# Patient Record
Sex: Female | Born: 1970 | Race: Asian | Marital: Married | Smoking: Never smoker
Health system: Southern US, Community
[De-identification: ages and names within clinical notes are randomized; demographics above are authoritative.]

## PROBLEM LIST (undated history)

## (undated) DIAGNOSIS — Z8632 Personal history of gestational diabetes: Secondary | ICD-10-CM

## (undated) DIAGNOSIS — E663 Overweight: Secondary | ICD-10-CM

## (undated) DIAGNOSIS — I10 Essential (primary) hypertension: Secondary | ICD-10-CM

## (undated) DIAGNOSIS — S93402S Sprain of unspecified ligament of left ankle, sequela: Secondary | ICD-10-CM

## (undated) DIAGNOSIS — F419 Anxiety disorder, unspecified: Secondary | ICD-10-CM

## (undated) DIAGNOSIS — T7840XA Allergy, unspecified, initial encounter: Secondary | ICD-10-CM

## (undated) DIAGNOSIS — Z8759 Personal history of other complications of pregnancy, childbirth and the puerperium: Secondary | ICD-10-CM

## (undated) DIAGNOSIS — Z87442 Personal history of urinary calculi: Secondary | ICD-10-CM

## (undated) HISTORY — DX: Allergy, unspecified, initial encounter: T78.40XA

## (undated) HISTORY — DX: Personal history of other complications of pregnancy, childbirth and the puerperium: Z87.59

## (undated) HISTORY — PX: TUBAL LIGATION: SHX77

## (undated) HISTORY — DX: Sprain of unspecified ligament of left ankle, sequela: S93.402S

## (undated) HISTORY — DX: Personal history of urinary calculi: Z87.442

## (undated) HISTORY — DX: Essential (primary) hypertension: I10

## (undated) HISTORY — PX: BREAST ENHANCEMENT SURGERY: SHX7

## (undated) HISTORY — DX: Anxiety disorder, unspecified: F41.9

## (undated) HISTORY — DX: Personal history of gestational diabetes: Z86.32

## (undated) HISTORY — DX: Overweight: E66.3

## (undated) HISTORY — PX: EXTRACORPOREAL SHOCK WAVE LITHOTRIPSY: SHX1557

---

## 1995-02-05 DIAGNOSIS — O1415 Severe pre-eclampsia, complicating the puerperium: Secondary | ICD-10-CM

## 1995-07-04 HISTORY — PX: COLPOSCOPY: SHX161

## 1995-09-01 HISTORY — PX: GYNECOLOGIC CRYOSURGERY: SHX857

## 2000-04-13 HISTORY — PX: DIAGNOSTIC LAPAROSCOPY: SUR761

## 2004-07-03 HISTORY — PX: AUGMENTATION MAMMAPLASTY: SUR837

## 2005-04-04 ENCOUNTER — Ambulatory Visit: Payer: Self-pay | Admitting: Obstetrics & Gynecology

## 2005-04-18 ENCOUNTER — Ambulatory Visit: Payer: Self-pay | Admitting: Obstetrics & Gynecology

## 2005-05-04 ENCOUNTER — Observation Stay: Payer: Self-pay | Admitting: Unknown Physician Specialty

## 2005-05-06 ENCOUNTER — Ambulatory Visit: Payer: Self-pay

## 2005-05-12 ENCOUNTER — Inpatient Hospital Stay: Payer: Self-pay | Admitting: Obstetrics & Gynecology

## 2005-05-13 DIAGNOSIS — O1410 Severe pre-eclampsia, unspecified trimester: Secondary | ICD-10-CM

## 2005-05-13 DIAGNOSIS — O24419 Gestational diabetes mellitus in pregnancy, unspecified control: Secondary | ICD-10-CM

## 2007-07-18 ENCOUNTER — Encounter: Payer: Self-pay | Admitting: Maternal & Fetal Medicine

## 2007-11-08 ENCOUNTER — Observation Stay: Payer: Self-pay | Admitting: Obstetrics & Gynecology

## 2007-11-09 ENCOUNTER — Ambulatory Visit: Payer: Self-pay

## 2007-12-16 ENCOUNTER — Inpatient Hospital Stay: Payer: Self-pay | Admitting: Obstetrics and Gynecology

## 2007-12-17 DIAGNOSIS — O24419 Gestational diabetes mellitus in pregnancy, unspecified control: Secondary | ICD-10-CM

## 2007-12-17 DIAGNOSIS — O1490 Unspecified pre-eclampsia, unspecified trimester: Secondary | ICD-10-CM

## 2010-07-03 HISTORY — PX: CYSTOSCOPY: SUR368

## 2011-03-24 ENCOUNTER — Emergency Department: Payer: Self-pay | Admitting: *Deleted

## 2011-04-03 ENCOUNTER — Ambulatory Visit: Payer: Self-pay | Admitting: Urology

## 2011-04-04 ENCOUNTER — Ambulatory Visit: Payer: Self-pay | Admitting: Urology

## 2011-04-10 ENCOUNTER — Ambulatory Visit: Payer: Self-pay | Admitting: Urology

## 2011-04-13 ENCOUNTER — Ambulatory Visit: Payer: Self-pay | Admitting: Urology

## 2011-04-28 ENCOUNTER — Ambulatory Visit: Payer: Self-pay | Admitting: Urology

## 2011-07-31 ENCOUNTER — Ambulatory Visit: Payer: Self-pay | Admitting: Urology

## 2012-10-01 ENCOUNTER — Ambulatory Visit: Payer: Self-pay | Admitting: Gynecologic Oncology

## 2012-10-31 ENCOUNTER — Ambulatory Visit: Payer: Self-pay | Admitting: Gynecologic Oncology

## 2014-08-03 LAB — HM MAMMOGRAPHY: HM Mammogram: NORMAL (ref 0–4)

## 2015-02-17 LAB — LIPID PANEL
CHOLESTEROL: 194 mg/dL (ref 0–200)
HDL: 29 mg/dL — AB (ref 35–70)
LDL CALC: 102 mg/dL
TRIGLYCERIDES: 317 mg/dL — AB (ref 40–160)

## 2015-02-17 LAB — BASIC METABOLIC PANEL: GLUCOSE: 157 mg/dL

## 2015-02-17 LAB — HEMOGLOBIN A1C: HEMOGLOBIN A1C: 6

## 2015-11-04 ENCOUNTER — Ambulatory Visit (INDEPENDENT_AMBULATORY_CARE_PROVIDER_SITE_OTHER): Payer: 59 | Admitting: Family Medicine

## 2015-11-04 ENCOUNTER — Encounter: Payer: Self-pay | Admitting: Family Medicine

## 2015-11-04 VITALS — BP 108/66 | HR 94 | Temp 98.2°F | Resp 16 | Ht <= 58 in | Wt 129.7 lb

## 2015-11-04 DIAGNOSIS — K5909 Other constipation: Secondary | ICD-10-CM

## 2015-11-04 DIAGNOSIS — R5383 Other fatigue: Secondary | ICD-10-CM

## 2015-11-04 DIAGNOSIS — K5900 Constipation, unspecified: Secondary | ICD-10-CM

## 2015-11-04 DIAGNOSIS — M71372 Other bursal cyst, left ankle and foot: Secondary | ICD-10-CM | POA: Diagnosis not present

## 2015-11-04 DIAGNOSIS — J302 Other seasonal allergic rhinitis: Secondary | ICD-10-CM | POA: Diagnosis not present

## 2015-11-04 DIAGNOSIS — F411 Generalized anxiety disorder: Secondary | ICD-10-CM | POA: Diagnosis not present

## 2015-11-04 DIAGNOSIS — E663 Overweight: Secondary | ICD-10-CM | POA: Diagnosis not present

## 2015-11-04 MED ORDER — DULOXETINE HCL 30 MG PO CPEP: 30.0000 mg | ORAL_CAPSULE | Freq: Every day | ORAL | Status: DC

## 2015-11-04 NOTE — Progress Notes (Signed)
Name: Kathleen Villanueva   MRN: [id number]    DOB: 01-02-1971   Date:11/04/2015       Progress Note  Subjective  Chief Complaint  Chief Complaint  Patient presents with  . Weight Gain    Patient would like to talk about weight loss options due to straing her ankle and being in pain. Would like to know is phentermine would be a good fit for her? Patient is stressed from work and needs a boost to help lose weigh and help with a appetite suppression.  . Constipation    Would like to discuss medication to help her go to the bathroom regularly, states she sometimes misses a day here and there.    HPI  Overweight: she wants medication to lose weight, but she is not willing to change her diet. She drinks coffee from [employer], drinks tea at work, she skips breakfast, and lunch, eats a lot of carbs at dinner. She wants phentermine, but explained she is not ready to lose weight, and she does not qualify for weight loss medication since she is overweight without risk factors. No exercising because of ankle cyst - wearing brace and seeing podiatrist  Chronic constipation: she skips bowel movements, but has been like that for a long time.   Fatigue: she states she feels tired all the time, she had labs done at work, but not sure if TSH , vitamin D and B12 were done.   Seasonal allergies: doing well, no symptoms at this time, she uses nasal spray prn  GAD: she states she had panic attacks in the past, when her daughter was young. She is getting worse feeling nervous all the time. Husband is complaining about her being snappy.   GAD 7 : Generalized Anxiety Score 11/04/2015  Nervous, Anxious, on Edge 3  Control/stop worrying 3  Worry too much - different things 3  Trouble relaxing 3  Restless 1  Easily annoyed or irritable 3  Afraid - awful might happen 3  Total GAD 7 Score 19  Anxiety Difficulty Very difficult      Patient Active Problem List   Diagnosis Date Noted  . Overweight (BMI 25.0-29.9)  11/04/2015  . Seasonal allergies 11/04/2015  . Chronic constipation 11/04/2015  . Other bursal cyst, left ankle and foot 11/04/2015    Past Surgical History  Procedure Laterality Date  . Tubal ligation    . Extracorporeal shock wave lithotripsy      Family History  Problem Relation Age of Onset  . Cancer Mother     cervical  . Cancer Maternal Uncle     Kidney    Social History   Social History  . Marital Status: Married    Spouse Name: N/A  . Number of Children: N/A  . Years of Education: N/A   Occupational History  . Not on file.   Social History Main Topics  . Smoking status: Never Smoker   . Smokeless tobacco: Never Used  . Alcohol Use: No  . Drug Use: No  . Sexual Activity:    Partners: Male   Other Topics Concern  . Not on file   Social History Narrative  . No narrative on file     Current outpatient prescriptions:  Marland Kitchen  Multiple Vitamins-Minerals (MULTIVITAMIN GUMMIES ADULT PO), Take by mouth., Disp: , Rfl:   No Known Allergies   ROS  Constitutional: Negative for fever or weight change.  Respiratory: Negative for cough and shortness of breath.   Cardiovascular: Negative for  chest pain or palpitations.  Gastrointestinal: Negative for abdominal pain, no bowel changes.  Musculoskeletal: Positive for gait problem or joint swelling.  Skin: Negative for rash.  Neurological: Negative for dizziness or headache.  No other specific complaints in a complete review of systems (except as listed in HPI above).   Objective  Filed Vitals:   11/04/15 1102  BP: 108/66  Pulse: 94  Temp: 98.2 F (36.8 C)  TempSrc: Oral  Resp: 16  Height: 4' 9"  (1.448 m)  Weight: 129 lb 11.2 oz (58.832 kg)  SpO2: 95%    Body mass index is 28.06 kg/(m^2).  Physical Exam  Constitutional: Patient appears well-developed and well-nourished. Overweight.  No distress.  HEENT: head atraumatic, normocephalic, pupils equal and reactive to light, neck supple, throat within  normal limits Cardiovascular: Normal rate, regular rhythm and normal heart sounds.  No murmur heard. No BLE edema. Pulmonary/Chest: Effort normal and breath sounds normal. No respiratory distress. Abdominal: Soft.  There is no tenderness. Psychiatric: Patient has a normal mood and affect. behavior is normal. Judgment and thought content normal.   PHQ2/9: Depression screen PHQ 2/9 11/04/2015  Decreased Interest 0  Down, Depressed, Hopeless 0  PHQ - 2 Score 0     Fall Risk: Fall Risk  11/04/2015  Falls in the past year? No    Functional Status Survey: Is the patient deaf or have difficulty hearing?: No Does the patient have difficulty seeing, even when wearing glasses/contacts?: No Does the patient have difficulty concentrating, remembering, or making decisions?: No Does the patient have difficulty walking or climbing stairs?: No Does the patient have difficulty dressing or bathing?: No Does the patient have difficulty doing errands alone such as visiting a doctor's office or shopping?: No    Assessment & Plan  1. Overweight (BMI 25.0-29.9)  Discussed with the patient the risk posed by an increased BMI. Discussed importance of portion control, calorie counting and at least 150 minutes of physical activity weekly. Avoid sweet beverages and drink more water. Eat at least 6 servings of fruit and vegetables daily   2. Seasonal allergies  Doing well at this time  3. Other fatigue  - TSH - VITAMIN D 25 Hydroxy (Vit-D Deficiency, Fractures) - B12  4. Chronic constipation  Discussed life style modification for now  5. Other bursal cyst, left ankle and foot  Seeing Dr. Jens Som, wearing soft sleeve

## 2015-11-05 ENCOUNTER — Other Ambulatory Visit: Payer: Self-pay | Admitting: Family Medicine

## 2015-11-05 ENCOUNTER — Encounter: Payer: Self-pay | Admitting: Family Medicine

## 2015-11-05 LAB — TSH: TSH: 0.681 u[IU]/mL (ref 0.450–4.500)

## 2015-11-05 LAB — VITAMIN B12: Vitamin B-12: 526 pg/mL (ref 211–946)

## 2015-11-05 LAB — VITAMIN D 25 HYDROXY (VIT D DEFICIENCY, FRACTURES): Vit D, 25-Hydroxy: 16 ng/mL — ABNORMAL LOW (ref 30.0–100.0)

## 2015-11-05 MED ORDER — VITAMIN D (ERGOCALCIFEROL) 1.25 MG (50000 UNIT) PO CAPS: 50000.0000 [IU] | ORAL_CAPSULE | ORAL | Status: DC

## 2015-11-25 ENCOUNTER — Encounter: Payer: Self-pay | Admitting: Family Medicine

## 2015-11-25 ENCOUNTER — Ambulatory Visit (INDEPENDENT_AMBULATORY_CARE_PROVIDER_SITE_OTHER): Payer: 59 | Admitting: Family Medicine

## 2015-11-25 VITALS — BP 108/64 | HR 89 | Temp 98.3°F | Resp 18 | Ht <= 58 in | Wt 126.5 lb

## 2015-11-25 DIAGNOSIS — E559 Vitamin D deficiency, unspecified: Secondary | ICD-10-CM | POA: Diagnosis not present

## 2015-11-25 DIAGNOSIS — E785 Hyperlipidemia, unspecified: Secondary | ICD-10-CM | POA: Diagnosis not present

## 2015-11-25 DIAGNOSIS — Z91018 Allergy to other foods: Secondary | ICD-10-CM | POA: Insufficient documentation

## 2015-11-25 DIAGNOSIS — E663 Overweight: Secondary | ICD-10-CM

## 2015-11-25 DIAGNOSIS — E786 Lipoprotein deficiency: Secondary | ICD-10-CM | POA: Diagnosis not present

## 2015-11-25 DIAGNOSIS — F411 Generalized anxiety disorder: Secondary | ICD-10-CM | POA: Diagnosis not present

## 2015-11-25 DIAGNOSIS — Z87442 Personal history of urinary calculi: Secondary | ICD-10-CM | POA: Insufficient documentation

## 2015-11-25 NOTE — Progress Notes (Signed)
Name: Kathleen Villanueva   MRN: [id number]    DOB: 1970/11/07   Date:11/25/2015       Progress Note  Subjective  Chief Complaint  Chief Complaint  Patient presents with  . Follow-up    patient is here for her 3-week f/u  . Obesity    Patient has lost 4 pounds since last visit due to cutting out coffee and sweet tea, cut back on pasta and rice. Patient would like to talk about weight lose medication to help her increase her weight loss.  . Anxiety    Patient has been controlling it better due to calling her son who is at [employer] daily due to being worried about him, and decreasing work load and symptoms have improved.    HPI  Overweight: She has changed her diet, eating fish twice weekly, cutting down on past, rice, eating fruit for breakfast. Stopped drinking [employer], no longer drinking sweet tea. She is also riding bikes outside with her children.   GAD: she states she had panic attacks in the past, when her daughter was young. She changed her work load, communicating better at home and at work, never started Cymbalta. Stopped caffeine and is feeling well.   Dyslipidemia: she has changed her diet, exercising more.   Patient Active Problem List   Diagnosis Date Noted  . Allergy to other foods 11/25/2015  . H/O renal calculi 11/25/2015  . Overweight (BMI 25.0-29.9) 11/04/2015  . Seasonal allergies 11/04/2015  . Chronic constipation 11/04/2015  . Other bursal cyst, left ankle and foot 11/04/2015    Past Surgical History  Procedure Laterality Date  . Tubal ligation    . Extracorporeal shock wave lithotripsy      Family History  Problem Relation Age of Onset  . Cancer Mother     cervical  . Cancer Maternal Uncle     Kidney    Social History   Social History  . Marital Status: Married    Spouse Name: N/A  . Number of Children: N/A  . Years of Education: N/A   Occupational History  . Not on file.   Social History Main Topics  . Smoking status: Never Smoker    . Smokeless tobacco: Never Used  . Alcohol Use: No  . Drug Use: No  . Sexual Activity:    Partners: Male   Other Topics Concern  . Not on file   Social History Narrative     Current outpatient prescriptions:  Marland Kitchen  Multiple Vitamins-Minerals (MULTIVITAMIN GUMMIES ADULT PO), Take by mouth., Disp: , Rfl:  .  Vitamin D, Ergocalciferol, (DRISDOL) 50000 units CAPS capsule, Take 1 capsule (50,000 Units total) by mouth every 7 (seven) days., Disp: 12 capsule, Rfl: 0  No Known Allergies   ROS  Ten systems reviewed and is negative except as mentioned in HPI   Objective  Filed Vitals:   11/25/15 1023  BP: 108/64  Pulse: 89  Temp: 98.3 F (36.8 C)  TempSrc: Oral  Resp: 18  Height: 4' 9"  (1.448 m)  Weight: 126 lb 8 oz (57.38 kg)  SpO2: 98%    Body mass index is 27.37 kg/(m^2).  Physical Exam  Constitutional: Patient appears well-developed and well-nourished. Overweight  No distress.  HEENT: head atraumatic, normocephalic, pupils equal and reactive to light,  neck supple, throat within normal limits Cardiovascular: Normal rate, regular rhythm and normal heart sounds.  No murmur heard. No BLE edema. Pulmonary/Chest: Effort normal and breath sounds normal. No respiratory distress. Abdominal:  Soft.  There is no tenderness. Psychiatric: Patient has a normal mood and affect. behavior is normal. Judgment and thought content normal.  Recent Results (from the past 2160 hour(s))  TSH     Status: None   Collection Time: 11/04/15 11:55 AM  Result Value Ref Range   TSH 0.681 0.450 - 4.500 uIU/mL  VITAMIN D 25 Hydroxy (Vit-D Deficiency, Fractures)     Status: Abnormal   Collection Time: 11/04/15 11:55 AM  Result Value Ref Range   Vit D, 25-Hydroxy 16.0 (L) 30.0 - 100.0 ng/mL    Comment: Vitamin D deficiency has been defined by the [place] and an Endocrine Society practice guideline as a level of serum 25-OH vitamin D less than 20 ng/mL (1,2). The Endocrine Society  went on to further define vitamin D insufficiency as a level between 21 and 29 ng/mL (2). 1. IOM (Institute of Medicine). 2010. Dietary reference    intakes for calcium and D. [place]: The    [employer]. 2. Holick MF, Binkley Candler, Bischoff-Ferrari HA, et al.    Evaluation, treatment, and prevention of vitamin D    deficiency: an Endocrine Society clinical practice    guideline. JCEM. 2011 Jul; 96(7):1911-30.   B12     Status: None   Collection Time: 11/04/15 11:55 AM  Result Value Ref Range   Vitamin B-12 526 211 - 946 pg/mL    PHQ2/9: Depression screen PHQ 2/9 11/04/2015  Decreased Interest 0  Down, Depressed, Hopeless 0  PHQ - 2 Score 0    Fall Risk: Fall Risk  11/04/2015  Falls in the past year? No     Assessment & Plan  1. GAD (generalized anxiety disorder)  Doing great, change  2. Overweight (BMI 25.0-29.9)  Doing great, changing her diet, more active, lost 4 lbs in the past few weeks with life style modivication   3. Vitamin D deficiency  Continue supplementation   4. Hyperlipidemia with low HDL  Lipid panel shows low HDL : to improve HDL patient  needs to eat tree nuts ( pecans/pistachios/almonds ) four times weekly, eat fish two times weekly  and exercise  at least 150 minutes per week

## 2015-12-02 ENCOUNTER — Other Ambulatory Visit: Payer: Self-pay | Admitting: Family Medicine

## 2015-12-02 NOTE — Telephone Encounter (Signed)
Patient requesting refill. 

## 2016-02-01 ENCOUNTER — Other Ambulatory Visit: Payer: Self-pay | Admitting: Family Medicine

## 2016-02-01 MED ORDER — VITAMIN D (ERGOCALCIFEROL) 1.25 MG (50000 UNIT) PO CAPS: 50000.0000 [IU] | ORAL_CAPSULE | ORAL | 0 refills | Status: DC

## 2016-02-16 LAB — LIPID PANEL
Cholesterol: 185 mg/dL (ref 0–200)
HDL: 34 mg/dL — AB (ref 35–70)
LDL Cholesterol: 110 mg/dL
Triglycerides: 204 mg/dL — AB (ref 40–160)

## 2016-02-16 LAB — BASIC METABOLIC PANEL: Glucose: 118 mg/dL

## 2016-02-16 LAB — HEMOGLOBIN A1C: Hemoglobin A1C: 5.5

## 2016-02-25 ENCOUNTER — Ambulatory Visit (INDEPENDENT_AMBULATORY_CARE_PROVIDER_SITE_OTHER): Payer: 59 | Admitting: Family Medicine

## 2016-02-25 ENCOUNTER — Encounter: Payer: Self-pay | Admitting: Family Medicine

## 2016-02-25 VITALS — BP 112/64 | HR 92 | Temp 98.0°F | Resp 16 | Ht <= 58 in | Wt 120.1 lb

## 2016-02-25 DIAGNOSIS — F411 Generalized anxiety disorder: Secondary | ICD-10-CM

## 2016-02-25 DIAGNOSIS — E559 Vitamin D deficiency, unspecified: Secondary | ICD-10-CM

## 2016-02-25 DIAGNOSIS — K5900 Constipation, unspecified: Secondary | ICD-10-CM

## 2016-02-25 DIAGNOSIS — E663 Overweight: Secondary | ICD-10-CM | POA: Diagnosis not present

## 2016-02-25 DIAGNOSIS — R739 Hyperglycemia, unspecified: Secondary | ICD-10-CM

## 2016-02-25 DIAGNOSIS — E785 Hyperlipidemia, unspecified: Secondary | ICD-10-CM

## 2016-02-25 DIAGNOSIS — K5909 Other constipation: Secondary | ICD-10-CM

## 2016-02-25 MED ORDER — MULTIVITAMIN GUMMIES ADULT PO CHEW: 1.0000 | CHEWABLE_TABLET | Freq: Every day | ORAL | 3 refills | Status: DC

## 2016-02-25 NOTE — Progress Notes (Signed)
Name: Kathleen Villanueva   MRN: [id number]    DOB: Nov 26, 1970   Date:02/25/2016       Progress Note  Subjective  Chief Complaint  Chief Complaint  Patient presents with  . Medication Refill    Needs Prescription for Vitamins due to Flex Spending Card will pay for it with a prescription.     HPI  Hyperglycemia: last year her hgbA1C was 6 and is down to 5.5%. She stopped eating rice, and desserts , smaller portions of pasta and occasionally bread. She is eating fish and salads on a regular basis. She has been more active, cooking at home and has lost 7 lbs, HDL has gone and LDL has gone down and hgbA1C is back to normal  Dyslipidemia: discussed ways to improved HDL even more  GAD: not having symptoms, she is excited that her weight is down, father had bypass surgery and is doing well now, face timing son more often to fill the void and is coping well with life right now.   Patient Active Problem List   Diagnosis Date Noted  . Allergy to other foods 11/25/2015  . H/O renal calculi 11/25/2015  . Vitamin D deficiency 11/25/2015  . Hyperlipidemia with low HDL 11/25/2015  . Overweight (BMI 25.0-29.9) 11/04/2015  . Seasonal allergies 11/04/2015  . Chronic constipation 11/04/2015  . Other bursal cyst, left ankle and foot 11/04/2015    Past Surgical History:  Procedure Laterality Date  . EXTRACORPOREAL SHOCK WAVE LITHOTRIPSY    . TUBAL LIGATION      Family History  Problem Relation Age of Onset  . Cancer Mother     cervical  . Cancer Maternal Uncle     Kidney    Social History   Social History  . Marital status: Married    Spouse name: N/A  . Number of children: N/A  . Years of education: N/A   Occupational History  . Not on file.   Social History Main Topics  . Smoking status: Never Smoker  . Smokeless tobacco: Never Used  . Alcohol use No  . Drug use: No  . Sexual activity: Yes    Partners: Male   Other Topics Concern  . Not on file   Social History Narrative   . No narrative on file     Current Outpatient Prescriptions:  Marland Kitchen  Multiple Vitamins-Minerals (MULTIVITAMIN GUMMIES ADULT PO), Take by mouth., Disp: , Rfl:  .  Vitamin D, Ergocalciferol, (DRISDOL) 50000 units CAPS capsule, Take 1 capsule (50,000 Units total) by mouth every 7 (seven) days., Disp: 4 capsule, Rfl: 0  No Known Allergies   ROS  Constitutional: Negative for fever , positive for weight change.  Respiratory: Negative for cough and shortness of breath.   Cardiovascular: Negative for chest pain or palpitations.  Gastrointestinal: Negative for abdominal pain, no bowel changes.  Musculoskeletal: Negative for gait problem or joint swelling.  Skin: Negative for rash.  Neurological: Negative for dizziness or headache.  No other specific complaints in a complete review of systems (except as listed in HPI above).  Objective  Vitals:   02/25/16 1452  BP: 112/64  Pulse: 92  Resp: 16  Temp: 98 F (36.7 C)  TempSrc: Oral  SpO2: 97%  Weight: 120 lb 1.6 oz (54.5 kg)  Height: 4' 9"  (1.448 m)    Body mass index is 25.99 kg/m.  Physical Exam  Constitutional: Patient appears well-developed and well-nourished.  No distress.  HEENT: head atraumatic, normocephalic, pupils equal and reactive to  light,  neck supple, throat within normal limits Cardiovascular: Normal rate, regular rhythm and normal heart sounds.  No murmur heard. No BLE edema. Pulmonary/Chest: Effort normal and breath sounds normal. No respiratory distress. Abdominal: Soft.  There is no tenderness. Psychiatric: Patient has a normal mood and affect. behavior is normal. Judgment and thought content normal.  Recent Results (from the past 2160 hour(s))  Basic metabolic panel     Status: None   Collection Time: 02/16/16 12:00 AM  Result Value Ref Range   Glucose 118 mg/dL  Lipid panel     Status: Abnormal   Collection Time: 02/16/16 12:00 AM  Result Value Ref Range   Triglycerides 204 (A) 40 - 160 mg/dL    Cholesterol 185 0 - 200 mg/dL   HDL 34 (A) 35 - 70 mg/dL   LDL Cholesterol 110 mg/dL  Hemoglobin A1c     Status: None   Collection Time: 02/16/16 12:00 AM  Result Value Ref Range   Hemoglobin A1C 5.5       PHQ2/9: Depression screen [facility] 2/9 02/25/2016 11/04/2015  Decreased Interest 0 0  Down, Depressed, Hopeless 0 0  PHQ - 2 Score 0 0    Fall Risk: Fall Risk  02/25/2016 11/04/2015  Falls in the past year? No No      Functional Status Survey: Is the patient deaf or have difficulty hearing?: No Does the patient have difficulty seeing, even when wearing glasses/contacts?: No Does the patient have difficulty concentrating, remembering, or making decisions?: No Does the patient have difficulty walking or climbing stairs?: No Does the patient have difficulty dressing or bathing?: No Does the patient have difficulty doing errands alone such as visiting a doctor's office or shopping?: No    Assessment & Plan   1. Dyslipidemia  Lipid panel shows low HDL : to improve HDL patient  needs to eat tree nuts ( pecans/pistachios/almonds ) four times weekly, eat fish two times weekly  and exercise  at least 150 minutes per week  2. Vitamin D deficiency  Continue supplementation   3. Overweight (BMI 25.0-29.9)  Almost to goal weight, it needs to be 115 lbs  4. Chronic constipation  Resolved with dietary modification   5. Hyperglycemia  Doing well now, hgbA1C is at goal   6. GAD (generalized anxiety disorder)  resolved

## 2016-02-29 ENCOUNTER — Encounter: Payer: Self-pay | Admitting: Family Medicine

## 2016-08-25 ENCOUNTER — Ambulatory Visit (INDEPENDENT_AMBULATORY_CARE_PROVIDER_SITE_OTHER): Payer: 59 | Admitting: Family Medicine

## 2016-08-25 ENCOUNTER — Encounter: Payer: Self-pay | Admitting: Family Medicine

## 2016-08-25 VITALS — BP 110/68 | HR 95 | Temp 98.0°F | Resp 16 | Ht <= 58 in | Wt 119.4 lb

## 2016-08-25 DIAGNOSIS — K5909 Other constipation: Secondary | ICD-10-CM

## 2016-08-25 DIAGNOSIS — E559 Vitamin D deficiency, unspecified: Secondary | ICD-10-CM | POA: Diagnosis not present

## 2016-08-25 DIAGNOSIS — E785 Hyperlipidemia, unspecified: Secondary | ICD-10-CM

## 2016-08-25 DIAGNOSIS — F411 Generalized anxiety disorder: Secondary | ICD-10-CM | POA: Insufficient documentation

## 2016-08-25 DIAGNOSIS — E663 Overweight: Secondary | ICD-10-CM | POA: Diagnosis not present

## 2016-08-25 DIAGNOSIS — R739 Hyperglycemia, unspecified: Secondary | ICD-10-CM | POA: Diagnosis not present

## 2016-08-25 MED ORDER — VITAMIN D (ERGOCALCIFEROL) 1.25 MG (50000 UNIT) PO CAPS: 50000.0000 [IU] | ORAL_CAPSULE | ORAL | 3 refills | Status: DC

## 2016-08-25 NOTE — Progress Notes (Signed)
Name: Kathleen Villanueva   MRN: [id number]    DOB: 16-Nov-1970   Date:08/25/2016       Progress Note  Subjective  Chief Complaint  Chief Complaint  Patient presents with  . Anxiety    doing better  . dyslipidemia  . Obesity  . Flu Vaccine    pt refused    HPI  Hyperglycemia: last year her hgbA1C was 6 and is down to 5.5%. She stopped eating rice, and desserts, smaller portions of pasta and occasionally bread. She is eating fish and salads on a regular basis. She has been more active, cooking at home and had lost 7 lbs initially and now has been maintaining.  Dyslipidemia: discussed ways to improved HDL even more, we will recheck labs. Continue life style changes  GAD: not having symptoms, eating healthier, father had bypass surgery and is doing well now, she has been doing video calls with her  son more often and she has been doing much better.   Vitamin D deficiency: she prefers taking weekly medications  Chronic constipation: doing much better with change in bowel movement. She has bowel movements about once a day, skips at most one day. No straining , no blood in stools   Patient Active Problem List   Diagnosis Date Noted  . GAD (generalized anxiety disorder) 08/25/2016  . Allergy to other foods 11/25/2015  . H/O renal calculi 11/25/2015  . Vitamin D deficiency 11/25/2015  . Hyperlipidemia with low HDL 11/25/2015  . Overweight (BMI 25.0-29.9) 11/04/2015  . Seasonal allergies 11/04/2015  . Chronic constipation 11/04/2015  . Other bursal cyst, left ankle and foot 11/04/2015    Past Surgical History:  Procedure Laterality Date  . EXTRACORPOREAL SHOCK WAVE LITHOTRIPSY    . TUBAL LIGATION      Family History  Problem Relation Age of Onset  . Cancer Mother     cervical  . Cancer Maternal Uncle     Kidney    Social History   Social History  . Marital status: Married    Spouse name: N/A  . Number of children: N/A  . Years of education: N/A   Occupational History   . Not on file.   Social History Main Topics  . Smoking status: Never Smoker  . Smokeless tobacco: Never Used  . Alcohol use No  . Drug use: No  . Sexual activity: Yes    Partners: Male   Other Topics Concern  . Not on file   Social History Narrative  . No narrative on file     Current Outpatient Prescriptions:  Marland Kitchen  Multiple Vitamins-Minerals (MULTIVITAMIN GUMMIES ADULT) CHEW, Chew 1 tablet by mouth daily., Disp: 100 tablet, Rfl: 3 .  Vitamin D, Ergocalciferol, (DRISDOL) 50000 units CAPS capsule, Take 1 capsule (50,000 Units total) by mouth every 7 (seven) days., Disp: 12 capsule, Rfl: 3  No Known Allergies   ROS  Constitutional: Negative for fever or weight change.  Respiratory: Negative for cough and shortness of breath.   Cardiovascular: Negative for chest pain or palpitations.  Gastrointestinal: Negative for abdominal pain, no bowel changes.  Musculoskeletal: Negative for gait problem or joint swelling.  Skin: Negative for rash.  Neurological: Negative for dizziness or headache.  No other specific complaints in a complete review of systems (except as listed in HPI above).  Objective  Vitals:   08/25/16 1336  BP: 110/68  Pulse: 95  Resp: 16  Temp: 98 F (36.7 C)  SpO2: 98%  Weight: 119 lb 7  oz (54.2 kg)  Height: 4' 9"  (1.448 m)    Body mass index is 25.85 kg/m.  Physical Exam  Constitutional: Patient appears well-developed and well-nourished. Overweight No distress.  HEENT: head atraumatic, normocephalic, pupils equal and reactive to light,  neck supple, throat within normal limits Cardiovascular: Normal rate, regular rhythm and normal heart sounds.  No murmur heard. No BLE edema. Pulmonary/Chest: Effort normal and breath sounds normal. No respiratory distress. Abdominal: Soft.  There is no tenderness. Psychiatric: Patient has a normal mood and affect. behavior is normal. Judgment and thought content normal.  PHQ2/9: Depression screen [facility] 2/9  02/25/2016 11/04/2015  Decreased Interest 0 0  Down, Depressed, Hopeless 0 0  PHQ - 2 Score 0 0     Fall Risk: Fall Risk  02/25/2016 11/04/2015  Falls in the past year? No No     Assessment & Plan  1. Dyslipidemia  - Lipid panel - Comprehensive metabolic panel  2. Vitamin D deficiency  - Vitamin D, Ergocalciferol, (DRISDOL) 50000 units CAPS capsule; Take 1 capsule (50,000 Units total) by mouth every 7 (seven) days.  Dispense: 12 capsule; Refill: 3 - Vitamin B12 - VITAMIN D 25 Hydroxy (Vit-D Deficiency, Fractures)  3. Hyperglycemia  - Hemoglobin A1c - Insulin, 2 Hour  4. Overweight (BMI 25.0-29.9)  - CBC with Differential/Platelet  5. GAD (generalized anxiety disorder)  She is doing better, she states last year a lot of her relatives died and she was very worried about her father's death, but doing much better now - CBC with Differential/Platelet - TSH  6. Chronic constipation  Doing better with change in diet

## 2016-09-20 ENCOUNTER — Encounter: Payer: Self-pay | Admitting: Certified Nurse Midwife

## 2016-09-20 ENCOUNTER — Ambulatory Visit (INDEPENDENT_AMBULATORY_CARE_PROVIDER_SITE_OTHER): Payer: 59 | Admitting: Certified Nurse Midwife

## 2016-09-20 ENCOUNTER — Other Ambulatory Visit: Payer: Self-pay | Admitting: Family Medicine

## 2016-09-20 VITALS — BP 130/90 | HR 84 | Ht <= 58 in | Wt 123.0 lb

## 2016-09-20 DIAGNOSIS — Z01419 Encounter for gynecological examination (general) (routine) without abnormal findings: Secondary | ICD-10-CM | POA: Diagnosis not present

## 2016-09-20 DIAGNOSIS — Z1231 Encounter for screening mammogram for malignant neoplasm of breast: Secondary | ICD-10-CM

## 2016-09-20 DIAGNOSIS — Z124 Encounter for screening for malignant neoplasm of cervix: Secondary | ICD-10-CM

## 2016-09-20 DIAGNOSIS — Z1239 Encounter for other screening for malignant neoplasm of breast: Secondary | ICD-10-CM

## 2016-09-20 MED ORDER — CALCIUM CITRATE-VITAMIN D 250-100 MG-UNIT PO TABS: 2.0000 | ORAL_TABLET | Freq: Every day | ORAL | 11 refills | Status: DC

## 2016-09-20 NOTE — Progress Notes (Signed)
Gynecology Annual Exam  PCP: Loistine Chance, MD  Chief Complaint:  Chief Complaint  Patient presents with  . Gynecologic Exam    History of Present Illness: Patient is a 46 y.o. Kathleen Villanueva presents for annual exam. The patient has no gyn complaints today. Patient was prescribed clobetasol ointment at her last annual for a chronic pruritic area on her right labia. This resolved the pruritis after a short course of therapy LMP: Patient's last menstrual period was 09/05/2016. Menses are monthly, lasting 7 days with 2 heavier days requiring pad changes q2 hours. Has noticed clots overnight on pad that are quarter size.Marland Kitchen Has dysmenorrhea with heavier flow that is relieved with Tylenol or Advil. Her current form of contraception is a BTL. Since her last annual 06/16/2015, she has changed her diet and eliminated caffeine from her diet, which helped her lose7#. She eats a lot of fish and salads. Does not get adequate calcium in her diet. Occasionally eats feta cheese and gets some spinach and broccoli in her diet. Takes a vitamin D2 50,000 IU every other week or so.  She exercises by walking. She does not smoke or drink alcohol.  Her last Pap smear was 06/16/2015 and was NIL/neg. Her last mammogram was 06/16/2015 and was negative. She does occasional SBEs. There is a family history of breast cancer in her maternal cousin at age 66. Genetic testing has not been done. There is no family history of ovarian cancer in her family.  Her screening for cholesterol and diabetes has been done by her PCP. She has had elevated triglycerides, but they have come down to 204 from 317 with the dietary changes she has made. HDL is a little low at 34, Total cholesterol 185, and LDL was 110 on 02/16/2016. Hemoglobin A1C was 5.5%. She will be having more labs drawn the end of this month.   Review of Systems: Review of Systems  Constitutional: Negative for chills, fever and weight loss.  HENT: Negative for congestion,  sinus pain and sore throat.   Eyes: Negative for blurred vision and pain.  Respiratory: Negative for hemoptysis, shortness of breath and wheezing.   Cardiovascular: Negative for chest pain, palpitations and leg swelling.  Gastrointestinal: Negative for abdominal pain, blood in stool, diarrhea, heartburn, nausea and vomiting.  Genitourinary: Negative for dysuria, frequency, hematuria and urgency.  Musculoskeletal: Negative for back pain, joint pain and myalgias.  Skin: Negative for itching and rash.  Neurological: Negative for dizziness, tingling and headaches.  Endo/Heme/Allergies: Negative for environmental allergies and polydipsia. Does not bruise/bleed easily.       Negative for hirsutism   Psychiatric/Behavioral: Negative for depression. The patient is not nervous/anxious and does not have insomnia.     Past Medical History:  Past Medical History:  Diagnosis Date  . Allergy   . History of gestational diabetes 2006/ 2009   with second and third pregnancy  . History of nephrolithiasis    seen by Dr. Jacqlyn Larsen Urologist   . History of pre-eclampsia    with all pregnancies  . Over weight   . Sprain of unspecified ligament of left ankle, sequela     Past Surgical History:  Past Surgical History:  Procedure Laterality Date  . BREAST ENHANCEMENT SURGERY Bilateral   . COLPOSCOPY  07/1995  . CYSTOSCOPY  2012  . DIAGNOSTIC LAPAROSCOPY  04/13/2000   for chronic RLQ pain; normal anatomy  . EXTRACORPOREAL SHOCK WAVE LITHOTRIPSY    . 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 TUBAL LIGATION       Obstetric History: [id number]  Family History:  Family History  Problem Relation Age of Onset  . Cancer Mother     cervical  . Cancer Maternal Uncle     Kidney  . Heart attack Father   . Hypertension Father   . Hypercholesterolemia Father     Social History:  Social History   Social History  . Marital status: Married    Spouse name: N/A  . Number of children: 3  . Years of  education: N/A   Occupational History  . Not on file.   Social History Main Topics  . Smoking status: Never Smoker  . Smokeless tobacco: Never Used  . Alcohol use No  . Drug use: No  . Sexual activity: Yes    Partners: Male    Birth control/ protection: Surgical   Other Topics Concern  . Not on file   Social History Narrative  . No narrative on file    Allergies:  No Known Allergies  Medications: Prior to Admission medications   Medication Sig Start Date End Date Taking? Authorizing Provider  Vitamin D, Ergocalciferol, (DRISDOL) 50000 units CAPS capsule Take 1 capsule (50,000 Units total) by mouth every 7 (seven) days. 08/25/16  Yes Steele Sizer, MD  calcium-vitamin D 250-100 MG-UNIT tablet Take 2 tablets by mouth daily. 09/20/16   Dalia Heading, CNM    Physical Exam Vitals: Blood pressure 130/90, pulse 84, height 4' 10"  (1.473 m), weight 55.8 kg (123 lb), last menstrual period 09/05/2016.  General: NAD HEENT: normocephalic, anicteric Thyroid: no enlargement, no palpable nodules Pulmonary: No increased work of breathing, CTAB Cardiovascular: RRR without murmur Breast: Breast symmetrical, s/p breast augmentation, no tenderness, no palpable nodules or masses, no skin or nipple retraction present, no nipple discharge.  No axillary, infraclavicular,  or supraclavicular lymphadenopathy. Abdomen: soft, non-tender, non-distended.  Umbilicus without lesions.  No hepatomegaly, or masses palpable. No evidence of hernia  Genitourinary:  External: Normal external female genitalia.  Normal urethral meatus, normal Bartholin's and Skene's glands.    Vagina: Normal vaginal mucosa, no evidence of prolapse.    Cervix: Grossly normal in appearance, no bleeding  Uterus: AV, NSSC,  mobile, NT  Adnexa: ovaries non-enlarged, no adnexal masses  Rectal: deferred  Lymphatic: no evidence of inguinal lymphadenopathy Extremities: no edema, erythema, or tenderness Neurologic: Grossly  intact Psychiatric: mood appropriate, affect full    Assessment: Well woman exam   Plan:  1) Mammogram  - recommend yearly screening mammogram -[facility]    2) Pap smear - Pap done  3) Osteoporosis prevention  Discussed calcium and vitamin D3 requirements. Given RX for calcium citrate  Continue exercise program and walking  4) Routine healthcare maintenance including cholesterol, diabetes screening thru her PCP   5) Follow up 1 year for routine annual  Dalia Heading, [place]

## 2016-09-20 NOTE — Patient Instructions (Signed)
Given handout on calcium requirements.

## 2016-09-20 NOTE — Addendum Note (Signed)
Addended by: Dalia Heading on: 09/20/2016 02:34 PM   Modules accepted: Orders

## 2016-09-22 LAB — IGP,RFX APTIMA HPV ALL PTH: PAP Smear Comment: 0

## 2016-09-30 LAB — COMPREHENSIVE METABOLIC PANEL
A/G RATIO: 1.3 (ref 1.2–2.2)
ALBUMIN: 4.4 g/dL (ref 3.5–5.5)
ALK PHOS: 31 IU/L — AB (ref 39–117)
ALT: 17 IU/L (ref 0–32)
AST: 17 IU/L (ref 0–40)
BILIRUBIN TOTAL: 0.5 mg/dL (ref 0.0–1.2)
BUN / CREAT RATIO: 16 (ref 9–23)
BUN: 14 mg/dL (ref 6–24)
CHLORIDE: 101 mmol/L (ref 96–106)
CO2: 21 mmol/L (ref 18–29)
Calcium: 9.5 mg/dL (ref 8.7–10.2)
Creatinine, Ser: 0.89 mg/dL (ref 0.57–1.00)
GFR calc non Af Amer: 79 mL/min/{1.73_m2} (ref >59)
GFR, EST AFRICAN AMERICAN: 91 mL/min/{1.73_m2} (ref >59)
GLUCOSE: 116 mg/dL — AB (ref 65–99)
Globulin, Total: 3.5 g/dL (ref 1.5–4.5)
POTASSIUM: 4.7 mmol/L (ref 3.5–5.2)
Sodium: 139 mmol/L (ref 134–144)
TOTAL PROTEIN: 7.9 g/dL (ref 6.0–8.5)

## 2016-09-30 LAB — LIPID PANEL
CHOLESTEROL TOTAL: 202 mg/dL — AB (ref 100–199)
Chol/HDL Ratio: 5.6 ratio units — ABNORMAL HIGH (ref 0.0–4.4)
HDL: 36 mg/dL — ABNORMAL LOW (ref >39)
LDL Calculated: 119 mg/dL — ABNORMAL HIGH (ref 0–99)
TRIGLYCERIDES: 233 mg/dL — AB (ref 0–149)
VLDL Cholesterol Cal: 47 mg/dL — ABNORMAL HIGH (ref 5–40)

## 2016-09-30 LAB — CBC WITH DIFFERENTIAL/PLATELET
BASOS ABS: 0 10*3/uL (ref 0.0–0.2)
Basos: 0 %
EOS (ABSOLUTE): 0.3 10*3/uL (ref 0.0–0.4)
Eos: 4 %
HEMOGLOBIN: 14.9 g/dL (ref 11.1–15.9)
Hematocrit: 43.8 % (ref 34.0–46.6)
IMMATURE GRANS (ABS): 0 10*3/uL (ref 0.0–0.1)
Immature Granulocytes: 0 %
LYMPHS ABS: 3.3 10*3/uL — AB (ref 0.7–3.1)
LYMPHS: 39 %
MCH: 29.3 pg (ref 26.6–33.0)
MCHC: 34 g/dL (ref 31.5–35.7)
MCV: 86 fL (ref 79–97)
MONOCYTES: 7 %
Monocytes Absolute: 0.6 10*3/uL (ref 0.1–0.9)
NEUTROS ABS: 4.2 10*3/uL (ref 1.4–7.0)
Neutrophils: 50 %
PLATELETS: 235 10*3/uL (ref 150–379)
RBC: 5.08 x10E6/uL (ref 3.77–5.28)
RDW: 13.3 % (ref 12.3–15.4)
WBC: 8.4 10*3/uL (ref 3.4–10.8)

## 2016-09-30 LAB — HEMOGLOBIN A1C
ESTIMATED AVERAGE GLUCOSE: 117 mg/dL
HEMOGLOBIN A1C: 5.7 % — AB (ref 4.8–5.6)

## 2016-09-30 LAB — INSULIN, 2 HOUR: INSULIN, 2 HOUR: 15.4 u[IU]/mL (ref 0.0–145.4)

## 2016-09-30 LAB — VITAMIN D 25 HYDROXY (VIT D DEFICIENCY, FRACTURES): VIT D 25 HYDROXY: 18.4 ng/mL — AB (ref 30.0–100.0)

## 2016-09-30 LAB — VITAMIN B12: VITAMIN B 12: 609 pg/mL (ref 232–1245)

## 2016-09-30 LAB — TSH: TSH: 0.748 u[IU]/mL (ref 0.450–4.500)

## 2016-10-09 ENCOUNTER — Other Ambulatory Visit: Payer: Self-pay | Admitting: Family Medicine

## 2016-10-20 ENCOUNTER — Ambulatory Visit
Admission: RE | Admit: 2016-10-20 | Discharge: 2016-10-20 | Disposition: A | Discharge status: Home / Self Care | Payer: 59 | Source: Ambulatory Visit | Attending: Family Medicine | Admitting: Family Medicine

## 2016-10-20 ENCOUNTER — Other Ambulatory Visit: Payer: Self-pay | Admitting: Family Medicine

## 2016-10-20 DIAGNOSIS — Z1231 Encounter for screening mammogram for malignant neoplasm of breast: Secondary | ICD-10-CM | POA: Insufficient documentation

## 2016-10-22 ENCOUNTER — Other Ambulatory Visit: Payer: Self-pay | Admitting: Family Medicine

## 2016-10-22 DIAGNOSIS — E559 Vitamin D deficiency, unspecified: Secondary | ICD-10-CM

## 2016-10-22 MED ORDER — VITAMIN D (ERGOCALCIFEROL) 1.25 MG (50000 UNIT) PO CAPS: 50000.0000 [IU] | ORAL_CAPSULE | ORAL | 3 refills | Status: DC

## 2017-09-26 ENCOUNTER — Ambulatory Visit (INDEPENDENT_AMBULATORY_CARE_PROVIDER_SITE_OTHER): Payer: Managed Care, Other (non HMO) | Admitting: Certified Nurse Midwife

## 2017-09-26 ENCOUNTER — Encounter: Payer: Self-pay | Admitting: Certified Nurse Midwife

## 2017-09-26 VITALS — BP 130/88 | HR 88 | Ht <= 58 in | Wt 128.0 lb

## 2017-09-26 DIAGNOSIS — Z124 Encounter for screening for malignant neoplasm of cervix: Secondary | ICD-10-CM | POA: Diagnosis not present

## 2017-09-26 DIAGNOSIS — Z1231 Encounter for screening mammogram for malignant neoplasm of breast: Secondary | ICD-10-CM | POA: Diagnosis not present

## 2017-09-26 DIAGNOSIS — Z01419 Encounter for gynecological examination (general) (routine) without abnormal findings: Secondary | ICD-10-CM

## 2017-09-26 DIAGNOSIS — Z1239 Encounter for other screening for malignant neoplasm of breast: Secondary | ICD-10-CM

## 2017-09-26 MED ORDER — CALCIUM-MAGNESIUM-VITAMIN D ER 600-40-500 MG-MG-UNIT PO TB24: 1.0000 | ORAL_TABLET | Freq: Every day | ORAL | 11 refills | Status: DC

## 2017-09-26 NOTE — Progress Notes (Signed)
Gynecology Annual Exam  PCP: Steele Sizer, MD  Chief Complaint:  Chief Complaint  Patient presents with  . Gynecologic Exam    History of Present Illness: Patient is a 47 y.o. [phone] Asian female who presents for her annual gyn exam. The patient has no gyn complaints today.  LMP: Patient's last menstrual period was 09/01/2017 (exact date). Menses are monthly, lasting 7 days with 1 heavier days requiring pad changes q 1-2 hours. Has noticed clots overnight on pad that are quarter size.Marland Kitchen Has dysmenorrhea with heavier flow that is relieved with Advil 200 mgm x1. Her current form of contraception is a BTL. Since her last annual 09/20/2016, she has done some stress eating and has regained the weight she had lost the earlier year.She was stressed when her father had CABG surgery and at the same time her son was being sent to [place] by the [employer]. Does not get adequate calcium in her diet. Occasionally eats feta cheese and gets some spinach and broccoli in her diet. Stopped taking vitamin D3 and calcium supplements.  She exercises by walking. She does not smoke or drink alcohol.  Her last Pap smear was 09/20/2016 and was NIL. Remote hx of CIN1 treated with cryo 1997 Her last mammogram was 10/20/2016 and was negative. She does monthly SBEs. She has had breast augmentation surgery. There is a family history of breast cancer in her maternal cousin at age 52. Genetic testing has not been done. There is no family history of ovarian cancer in her family.  Her screening for cholesterol and diabetes has been done by her PCP 09/29/2016. She has had elevated triglycerides, but they have come down to 233 from a high of 317.  HDL is a little low at 36, Total cholesterol 202 (up from 185), and LDL was 119. Hemoglobin A1C was 5.7%. Vitamin D3 18.4 She will be having more labs drawn at the time of her annual in the next month or two.   Review of Systems: Review of Systems  Constitutional: Negative for  chills, fever and weight loss.  HENT: Negative for congestion, sinus pain and sore throat.   Eyes: Negative for blurred vision and pain.  Respiratory: Negative for hemoptysis, shortness of breath and wheezing.   Cardiovascular: Negative for chest pain, palpitations and leg swelling.  Gastrointestinal: Negative for abdominal pain, blood in stool, diarrhea, heartburn, nausea and vomiting.  Genitourinary: Negative for dysuria, frequency, hematuria and urgency.  Musculoskeletal: Negative for back pain, joint pain and myalgias.  Skin: Negative for itching and rash.  Neurological: Negative for dizziness, tingling and headaches.  Endo/Heme/Allergies: Positive for environmental allergies (positive for sneezing). Negative for polydipsia. Does not bruise/bleed easily.       Negative for hirsutism   Psychiatric/Behavioral: Negative for depression. The patient is not nervous/anxious and does not have insomnia.     Past Medical History:  Past Medical History:  Diagnosis Date  . Allergy   . History of gestational diabetes 2006/ 2009   with second and third pregnancy  . History of nephrolithiasis    seen by Dr. Jacqlyn Larsen Urologist   . History of pre-eclampsia    with all pregnancies  . Over weight   . Sprain of unspecified ligament of left ankle, sequela     Past Surgical History:  Past Surgical History:  Procedure Laterality Date  . AUGMENTATION MAMMAPLASTY Bilateral 07/03/2004  . BREAST ENHANCEMENT SURGERY Bilateral   . COLPOSCOPY  07/1995  . CYSTOSCOPY  2012  . DIAGNOSTIC  LAPAROSCOPY  04/13/2000   for chronic RLQ pain; normal anatomy  . EXTRACORPOREAL SHOCK WAVE LITHOTRIPSY    . 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 TUBAL LIGATION       Obstetric History: [id number]  Family History:  Family History  Problem Relation Age of Onset  . Cancer Mother        cervical  . Cancer Maternal Uncle        Kidney  . Heart attack Father   . Hypertension Father   . Hypercholesterolemia  Father   . Gout Father   . Gout Brother   . Breast cancer Cousin 18    Social History:  Social History   Socioeconomic History  . Marital status: Married    Spouse name: Not on file  . Number of children: 3  . Years of education: Not on file  . Highest education level: Not on file  Occupational History  . Not on file  Social Needs  . Financial resource strain: Not on file  . Food insecurity:    Worry: Not on file    Inability: Not on file  . Transportation needs:    Medical: Not on file    Non-medical: Not on file  Tobacco Use  . Smoking status: Never Smoker  . Smokeless tobacco: Never Used  Substance and Sexual Activity  . Alcohol use: No    Alcohol/week: 0.0 oz  . Drug use: No  . Sexual activity: Yes    Partners: Male    Birth control/protection: Surgical  Lifestyle  . Physical activity:    Days per week: 0 days    Minutes per session: 0 min  . Stress: Only a little  Relationships  . Social connections:    Talks on phone: Not on file    Gets together: Not on file    Attends religious service: Not on file    Active member of club or organization: Not on file    Attends meetings of clubs or organizations: Not on file    Relationship status: Not on file  . Intimate partner violence:    Fear of current or ex partner: Not on file    Emotionally abused: Not on file    Physically abused: Not on file    Forced sexual activity: Not on file  Other Topics Concern  . Not on file  Social History Narrative  . Not on file   OB History  Gravida Para Term Preterm AB Living  5 3 1 2 2 3   SAB TAB Ectopic Multiple Live Births  2       3    # Outcome Date GA Lbr Len/2nd Weight Sex Delivery Anes PTL Lv  5 Preterm 12/17/07 [redacted]w[redacted]d 2.608 kg (5 lb 12 oz) F Vag-Spont  N LIV     Birth Comments: increased Down syndrome risk-normal chromosomes on amniocentesis.     Complications: Preeclampsia, Gestational diabetes  4 Preterm 05/13/05 340w0d2.41 kg (5 lb 5 oz) M Vag-Spont  N  LIV     Birth Comments: IOL for severe preeclampsia     Complications: Severe preeclampsia, Gestational diabetes  3 Term 02/05/95 3764w0d.892 kg (6 lb 6 oz) M Vag-Spont   LIV     Complications: Chorioamnionitis, Hypertension in pregnancy, preeclampsia, severe, delivered/postpartum  2 SAB      SAB     1 SAB             Obstetric Comments  All labors were induced for preeclampsia-received magnesium sulfate with G1 and G2   Allergies:  No Known Allergies  Medications: none  Physical Exam Vitals: BP 130/88   Pulse 88   Ht 4' 10"  (1.473 m)   Wt 58.1 kg (128 lb)   LMP 09/01/2017 (Exact Date)   BMI 26.75 kg/m  General: Asian female in NAD HEENT: normocephalic, anicteric Thyroid: no enlargement, no palpable nodules Pulmonary: No increased work of breathing, CTAB Cardiovascular: RRR without murmur Breast: Breast symmetrical, s/p breast augmentation, no tenderness, no palpable nodules or masses, no skin or nipple retraction present, no nipple discharge.  No axillary, infraclavicular,  or supraclavicular lymphadenopathy. Abdomen: soft, non-tender, non-distended.  Umbilicus without lesions.  No hepatomegaly, or masses palpable. No evidence of hernia  Genitourinary:  External: Normal external female genitalia.  Normal urethral meatus, normal Bartholin's and Skene's glands.    Vagina: Normal vaginal mucosa, no evidence of prolapse.  Cervix: Grossly normal in appearance, not friable  Uterus: AV, NSSC,  mobile, NT  Adnexa: ovaries non-enlarged, no adnexal masses  Rectal: deferred  Lymphatic: no evidence of inguinal lymphadenopathy Extremities: no edema, erythema, or tenderness Neurologic: Grossly intact Psychiatric: mood appropriate, affect full    Assessment: Well woman exam   Plan:  1) Mammogram  - recommend yearly screening mammogram -patient to schedule at [facility] for after 10/20/2017  2) Pap smear - Pap done  3) Osteoporosis prevention  Discussed calcium and vitamin D3  requirements. Given RX for calcium citrate with vitamin D3  4) Routine healthcare maintenance including cholesterol, diabetes screening thru her PCP   5) Follow up 1 year for routine annual  Dalia Heading, [place]

## 2017-09-27 ENCOUNTER — Encounter: Payer: Self-pay | Admitting: Certified Nurse Midwife

## 2017-09-29 LAB — IGP, APTIMA HPV
HPV Aptima: NEGATIVE
PAP SMEAR COMMENT: 0

## 2018-02-06 LAB — HEMOGLOBIN A1C: Hgb A1c MFr Bld: 6.4 — AB (ref 4.0–6.0)

## 2018-02-06 LAB — BASIC METABOLIC PANEL
CREATININE: 0.8 (ref 0.5–1.1)
GLUCOSE: 131

## 2018-02-06 LAB — LIPID PANEL
CHOLESTEROL: 181 (ref 0–200)
HDL: 33 — AB (ref 35–70)
LDL Cholesterol: 106
LDl/HDL Ratio: 5.5
Triglycerides: 212 — AB (ref 40–160)

## 2018-06-12 ENCOUNTER — Encounter: Payer: Self-pay | Admitting: Family Medicine

## 2018-07-02 ENCOUNTER — Other Ambulatory Visit: Payer: Self-pay | Admitting: Family Medicine

## 2018-07-02 DIAGNOSIS — Z1231 Encounter for screening mammogram for malignant neoplasm of breast: Secondary | ICD-10-CM

## 2018-07-05 ENCOUNTER — Ambulatory Visit
Admission: RE | Admit: 2018-07-05 | Discharge: 2018-07-05 | Disposition: A | Discharge status: Home / Self Care | Payer: Managed Care, Other (non HMO) | Source: Ambulatory Visit | Attending: Family Medicine | Admitting: Family Medicine

## 2018-07-05 DIAGNOSIS — Z1231 Encounter for screening mammogram for malignant neoplasm of breast: Secondary | ICD-10-CM | POA: Insufficient documentation

## 2018-07-11 ENCOUNTER — Ambulatory Visit: Payer: Self-pay

## 2018-07-11 ENCOUNTER — Other Ambulatory Visit: Payer: Self-pay

## 2018-07-11 ENCOUNTER — Encounter: Payer: Self-pay | Admitting: *Deleted

## 2018-07-11 ENCOUNTER — Emergency Department
Admission: EM | Admit: 2018-07-11 | Discharge: 2018-07-11 | Disposition: A | Discharge status: Home / Self Care | Payer: Managed Care, Other (non HMO) | Attending: Emergency Medicine | Admitting: Emergency Medicine

## 2018-07-11 ENCOUNTER — Emergency Department: Payer: Managed Care, Other (non HMO)

## 2018-07-11 DIAGNOSIS — I10 Essential (primary) hypertension: Secondary | ICD-10-CM

## 2018-07-11 DIAGNOSIS — Z79899 Other long term (current) drug therapy: Secondary | ICD-10-CM | POA: Insufficient documentation

## 2018-07-11 DIAGNOSIS — R079 Chest pain, unspecified: Secondary | ICD-10-CM | POA: Diagnosis present

## 2018-07-11 LAB — CBC
HCT: 44.6 % (ref 36.0–46.0)
Hemoglobin: 15 g/dL (ref 12.0–15.0)
MCH: 28.1 pg (ref 26.0–34.0)
MCHC: 33.6 g/dL (ref 30.0–36.0)
MCV: 83.5 fL (ref 80.0–100.0)
NRBC: 0 % (ref 0.0–0.2)
Platelets: 276 10*3/uL (ref 150–400)
RBC: 5.34 MIL/uL — AB (ref 3.87–5.11)
RDW: 12.4 % (ref 11.5–15.5)
WBC: 8 10*3/uL (ref 4.0–10.5)

## 2018-07-11 LAB — BASIC METABOLIC PANEL
ANION GAP: 8 (ref 5–15)
BUN: 10 mg/dL (ref 6–20)
CO2: 24 mmol/L (ref 22–32)
Calcium: 8.9 mg/dL (ref 8.9–10.3)
Chloride: 104 mmol/L (ref 98–111)
Creatinine, Ser: 0.81 mg/dL (ref 0.44–1.00)
GFR calc Af Amer: >60 mL/min (ref >60)
GFR calc non Af Amer: >60 mL/min (ref >60)
Glucose, Bld: 102 mg/dL — ABNORMAL HIGH (ref 70–99)
POTASSIUM: 4.1 mmol/L (ref 3.5–5.1)
Sodium: 136 mmol/L (ref 135–145)

## 2018-07-11 LAB — TROPONIN I: Troponin I: <0.03 ng/mL (ref <0.03)

## 2018-07-11 MED ORDER — HYDROXYZINE HCL 25 MG PO TABS: 25.0000 mg | ORAL_TABLET | Freq: Three times a day (TID) | ORAL | 0 refills | Status: DC | PRN

## 2018-07-11 NOTE — ED Triage Notes (Signed)
Pt to ED reporting chest pain since Tuesday that has been persistent. Called PCP to request an appointment. BP when checked three times was elevated and pt was advised to come to the ED. CP has dissipated at this time but pt describes it has tightness in her chest with SOB.

## 2018-07-11 NOTE — ED Provider Notes (Signed)
[facility] Emergency Department Provider Note  Time seen: 5:08 PM  I have reviewed the triage vital signs and the nursing notes.   HISTORY  Chief Complaint Chest Pain and Hypertension    HPI Kathleen Villanueva is a 48 y.o. female with a past medical history of anxiety, presents to the emergency department for intermittent chest discomfort.  According to the patient for the past 2 days she has intermittently been experiencing sharp pains in her chest.  States the pain is last 1 to 2 seconds and then go away.  No obvious identifying factors to cause the discomfort.  Denies any pleuritic pain.  No shortness of breath.  No nausea.  No leg pain or swelling.  No hormone use.  Patient does state a history of anxiety, with recent stressors as her son is in the [employer].  Denies any discomfort currently.  Patient checked her blood pressure 3 times over short course of time it was extremely elevated she called her doctor they recommended she come to the ER for evaluation.   Past Medical History:  Diagnosis Date  . Allergy   . History of gestational diabetes 2006/ 2009   with second and third pregnancy  . History of nephrolithiasis    seen by Dr. Jacqlyn Larsen Urologist   . History of pre-eclampsia    with all pregnancies  . Over weight   . Sprain of unspecified ligament of left ankle, sequela     Patient Active Problem List   Diagnosis Date Noted  . GAD (generalized anxiety disorder) 08/25/2016  . Allergy to other foods 11/25/2015  . H/O renal calculi 11/25/2015  . Vitamin D deficiency 11/25/2015  . Hyperlipidemia with low HDL 11/25/2015  . Overweight (BMI 25.0-29.9) 11/04/2015  . Seasonal allergies 11/04/2015  . Chronic constipation 11/04/2015  . Other bursal cyst, left ankle and foot 11/04/2015    Past Surgical History:  Procedure Laterality Date  . AUGMENTATION MAMMAPLASTY Bilateral 07/03/2004  . BREAST ENHANCEMENT SURGERY Bilateral   . COLPOSCOPY  07/1995  .  CYSTOSCOPY  2012  . DIAGNOSTIC LAPAROSCOPY  04/13/2000   for chronic RLQ pain; normal anatomy  . EXTRACORPOREAL SHOCK WAVE LITHOTRIPSY    . 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 TUBAL LIGATION      Prior to Admission medications   Medication Sig Start Date End Date Taking? Authorizing Provider  Calcium-Magnesium-Vitamin D (CITRACAL CALCIUM+D) 600-40-500 MG-MG-UNIT TB24 Take 1 tablet by mouth daily. 09/26/17   Dalia Heading, CNM  calcium-vitamin D 250-100 MG-UNIT tablet Take 2 tablets by mouth daily. 09/20/16   Dalia Heading, CNM  Vitamin D, Ergocalciferol, (DRISDOL) 50000 units CAPS capsule Take 1 capsule (50,000 Units total) by mouth 2 (two) times a week. Patient not taking: Reported on 09/26/2017 10/23/16   Steele Sizer, MD    No Known Allergies  Family History  Problem Relation Age of Onset  . Cancer Mother        cervical  . Cancer Maternal Uncle        Kidney  . Heart attack Father   . Hypertension Father   . Hypercholesterolemia Father   . Gout Father   . Gout Brother   . Breast cancer Cousin 8    Social History Social History   Tobacco Use  . Smoking status: Never Smoker  . Smokeless tobacco: Never Used  Substance Use Topics  . Alcohol use: No    Alcohol/week: 0.0 standard drinks  . Drug use: No  Review of Systems Constitutional: Negative for fever. Cardiovascular: Negative for chest pain. Respiratory: Negative for shortness of breath. Gastrointestinal: Negative for abdominal pain, vomiting and diarrhea. Genitourinary: Negative for urinary compaints Musculoskeletal: Negative for musculoskeletal complaints Skin: Negative for skin complaints  Neurological: Negative for headache All other ROS negative  ____________________________________________   PHYSICAL EXAM:  VITAL SIGNS: ED Triage Vitals [07/11/18 1337]  Enc Vitals Group     BP (!) 200/102     Pulse Rate 79     Resp 16     Temp 98.2 F (36.8 C)     Temp Source Oral      SpO2 100 %     Weight 120 lb (54.4 kg)     Height 4' 10"  (1.473 m)     Head Circumference      Peak Flow      Pain Score 3     Pain Loc      Pain Edu?      Excl. in [place]?    Constitutional: Alert and oriented. Well appearing and in no distress. Eyes: Normal exam ENT   Head: Normocephalic and atraumatic.   Mouth/Throat: Mucous membranes are moist. Cardiovascular: Normal rate, regular rhythm. No murmur Respiratory: Normal respiratory effort without tachypnea nor retractions. Breath sounds are clear  Gastrointestinal: Soft and nontender. No distention.   Musculoskeletal: Nontender with normal range of motion in all extremities. No lower extremity tenderness or edema. Neurologic:  Normal speech and language. No gross focal neurologic deficits  Skin:  Skin is warm, dry and intact.  Psychiatric: Mood and affect are normal.   ____________________________________________    EKG  EKG viewed and interpreted by myself shows a normal sinus rhythm at 79 bpm with a narrow QRS, normal axis, normal intervals, no concerning ST changes.  Normal EKG.  ____________________________________________    RADIOLOGY  Chest x-ray negative  ____________________________________________   INITIAL IMPRESSION / ASSESSMENT AND PLAN / ED COURSE  Pertinent labs & imaging results that were available during my care of the patient were reviewed by me and considered in my medical decision making (see chart for details).  Patient presents to the emergency department for intermittent chest pain over the past 2 days but overall the patient appears very well, no distress.  Denies any chest pain currently.  Patient symptoms are very suggestive of noncardiac chest pain.  Describes a sharp pinching type pain that occurs over 1 to 2 seconds and then goes away.  Denies any pain currently.  No pleuritic nature.  She is PERC negative.  Patient's labs are reassuring including a negative troponin.  Patient's blood  pressure is elevated 182/102.  Patient has a primary care doctor and states her blood pressure is usually normal at her doctor.  We will repeat a troponin.  As long as the patient's repeat troponin is negative given her reassuring work-up and physical exam I believe she is safe for discharge home with PCP follow-up regarding her blood pressure.  Patient agreeable to plan of care.  Patient's repeat troponin is negative.  We will discharge home with PCP follow-up.  Patient agreeable to plan of care.  ____________________________________________   FINAL CLINICAL IMPRESSION(S) / ED DIAGNOSES  Chest pain   Harvest Dark, MD 07/11/18 1901

## 2018-07-11 NOTE — Telephone Encounter (Signed)
Incoming call from Patient with complaint of chest pain in the middle of chest between her breast.    Patient states that " it feels like there is something stuck there".  Last less than 5 minutes.  Took Tums last night,  No relief.  Onset was yesterday. Pattern is that it comes sand goes.   Patient states that duration is quick. Severity is rated moderate. Denies cardiac history.  Gets nervous at work some times.        Also took CBD oil.  Patient also states that she is stressed at her job.  Making deadlines etc.  Recommended that Patient go to an Urgent Care to rule out MI.Marland Kitchen Patient states manager will drive her there.  Reason for Disposition . Chest pain(s) lasting a few seconds  Answer Assessment - Initial Assessment Questions 1. LOCATION: "Where does it hurt?"       Middle between breast 2. RADIATION: "Does the pain go anywhere else?" (e.g., into neck, jaw, arms, back)     *No Answer* 3. ONSET: "When did the chest pain begin?" (Minutes, hours or days)      yesterday 4. PATTERN "Does the pain come and go, or has it been constant since it started?"  "Does it get worse with exertion?"      Comes and goes 5. DURATION: "How long does it last" (e.g., seconds, minutes, hours)     Quick  6. SEVERITY: "How bad is the pain?"  (e.g., Scale 1-10; mild, moderate, or severe)    - MILD (1-3): doesn't interfere with normal activities     - MODERATE (4-7): interferes with normal activities or awakens from sleep    - SEVERE (8-10): excruciating pain, unable to do any normal activities       moderate 7. CARDIAC RISK FACTORS: "Do you have any history of heart problems or risk factors for heart disease?" (e.g., prior heart attack, angina; high blood pressure, diabetes, being overweight, high cholesterol, smoking, or strong family history of heart disease)     Denies nervous at work at times 8. PULMONARY RISK FACTORS: "Do you have any history of lung disease?"  (e.g., blood clots in lung, asthma, emphysema,  birth control pills)     Denies 9. CAUSE: "What do you think is causing the chest pain?"     *No Answer* 10. OTHER SYMPTOMS: "Do you have any other symptoms?" (e.g., dizziness, nausea, vomiting, sweating, fever, difficulty breathing, cough)       *No Answer* 11. PREGNANCY: "Is there any chance you are pregnant?" "When was your last menstrual period?"       *No Answer*  Protocols used: CHEST PAIN-A-AH

## 2018-07-17 ENCOUNTER — Ambulatory Visit: Payer: Managed Care, Other (non HMO) | Admitting: Family Medicine

## 2018-07-17 ENCOUNTER — Encounter: Payer: Self-pay | Admitting: Family Medicine

## 2018-07-17 VITALS — BP 180/100 | HR 88 | Temp 97.8°F | Resp 16 | Ht <= 58 in | Wt 125.8 lb

## 2018-07-17 DIAGNOSIS — R0789 Other chest pain: Secondary | ICD-10-CM

## 2018-07-17 DIAGNOSIS — E559 Vitamin D deficiency, unspecified: Secondary | ICD-10-CM | POA: Diagnosis not present

## 2018-07-17 DIAGNOSIS — Z23 Encounter for immunization: Secondary | ICD-10-CM

## 2018-07-17 DIAGNOSIS — E663 Overweight: Secondary | ICD-10-CM

## 2018-07-17 DIAGNOSIS — E8881 Metabolic syndrome: Secondary | ICD-10-CM

## 2018-07-17 DIAGNOSIS — I10 Essential (primary) hypertension: Secondary | ICD-10-CM

## 2018-07-17 DIAGNOSIS — E785 Hyperlipidemia, unspecified: Secondary | ICD-10-CM

## 2018-07-17 DIAGNOSIS — F411 Generalized anxiety disorder: Secondary | ICD-10-CM

## 2018-07-17 MED ORDER — HYDROCHLOROTHIAZIDE 12.5 MG PO TABS: 12.5000 mg | ORAL_TABLET | Freq: Every day | ORAL | 0 refills | Status: DC

## 2018-07-17 MED ORDER — ESCITALOPRAM OXALATE 5 MG PO TABS: 5.0000 mg | ORAL_TABLET | Freq: Every day | ORAL | 0 refills | Status: DC

## 2018-07-17 MED ORDER — VITAMIN D 50 MCG (2000 UT) PO CAPS: 1.0000 | ORAL_CAPSULE | Freq: Every day | ORAL | 0 refills | Status: DC

## 2018-07-17 MED ORDER — METFORMIN HCL ER 500 MG PO TB24: 500.0000 mg | ORAL_TABLET | Freq: Every day | ORAL | 0 refills | Status: DC

## 2018-07-17 NOTE — Progress Notes (Signed)
Name: Kathleen Villanueva   MRN: [id number]    DOB: May 12, 1971   Date:07/17/2018       Progress Note  Subjective  Chief Complaint  Chief Complaint  Patient presents with  . Hospitalization Follow-up    chest pain, htn. Blood pressure continues to be elevated checked it at work the other day and it was 156/104. She denies having chest pain, palpitations, dizziness, or visual disturbances. She has headaches on and off. She does not want to be on bp meds, but wants to work on her diet.    HPI  Chest pain: she states she has been under more stress over the past 14 months since she got a job promotion. She has been unable to rest at night. She called to schedule annual exam and said she was having intermittent chest pain from 01/07-01/09 and was advised to go to [facility] on 07/11/2017. Reviewed labs - BMP , troponin and CBC , EKG all within normal limits. She was sent home on hydroxyzine but she did not start taking medication, worried she will get sleepy. Chest pain was described as epigastric and like a tight sensation. She did not try any otc medication, episodes only a few seconds long, not associated with diaphoresis, nausea or SOB.   Metabolic syndrome: she had health screen at work back in 01/2018, hgbA1C was up to 6.4%, she also has HTN now and hypertriglyceridemia. Explained that she is very close to becoming a diabetic and importance of life style modification including exercise and following a diabetic diet. Discussed diabetic teaching class and  starting on medication with patient today to prevent progression to DM. She is willing to try Metformin   HTN: it was normal during health screen at work last Summer and twice last year during office visits. However high during [facility] visit, at work and today during her visit. She denies chest pain since she left [facility]. No decrease in exercise tolerance or palpitation .   GAD: she has a long history of anxiety, but getting worse over the past year. 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 She states her husband has noticed a change in her mood, she is also unable to rest at night. She spoke to her husband and she is willing to get a pay cut to be able to move back down.    Patient Active Problem List   Diagnosis Date Noted  . GAD (generalized anxiety disorder) 08/25/2016  . Allergy to other foods 11/25/2015  . H/O renal calculi 11/25/2015  . Vitamin D deficiency 11/25/2015  . Hyperlipidemia with low HDL 11/25/2015  . Overweight (BMI 25.0-29.9) 11/04/2015  . Seasonal allergies 11/04/2015  . Chronic constipation 11/04/2015  . Other bursal cyst, left ankle and foot 11/04/2015    Past Surgical History:  Procedure Laterality Date  . AUGMENTATION MAMMAPLASTY Bilateral 07/03/2004  . BREAST ENHANCEMENT SURGERY Bilateral   . COLPOSCOPY  07/1995  . CYSTOSCOPY  2012  . DIAGNOSTIC LAPAROSCOPY  04/13/2000   for chronic RLQ pain; normal anatomy  . EXTRACORPOREAL SHOCK WAVE LITHOTRIPSY    . 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 TUBAL LIGATION      Family History  Problem Relation Age of Onset  . Cancer Mother        cervical  . Cancer Maternal Uncle        Kidney  . Heart attack Father   . Hypertension Father   . Hypercholesterolemia Father   .  Gout Father   . Gout Brother   . Breast cancer Cousin 74    Social History   Socioeconomic History  . Marital status: Married    Spouse name: Not on file  . Number of children: 3  . Years of education: Not on file  . Highest education level: Not on file  Occupational History  . Occupation: Management consultant     Comment: lab [employer]  . Financial resource strain: Not hard at all  . Food insecurity:    Worry: Never true    Inability: Never true  . Transportation needs:    Medical: No    Non-medical: No  Tobacco Use  . Smoking status: Never Smoker  . Smokeless tobacco: Never Used  Substance and Sexual Activity  . Alcohol use:  No    Alcohol/week: 0.0 standard drinks  . Drug use: No  . Sexual activity: Yes    Partners: Male    Birth control/protection: Surgical  Lifestyle  . Physical activity:    Days per week: 0 days    Minutes per session: 0 min  . Stress: Very much  Relationships  . Social connections:    Talks on phone: Not on file    Gets together: Not on file    Attends religious service: Not on file    Active member of club or organization: Not on file    Attends meetings of clubs or organizations: Not on file    Relationship status: Not on file  . Intimate partner violence:    Fear of current or ex partner: No    Emotionally abused: No    Physically abused: No    Forced sexual activity: No  Other Topics Concern  . Not on file  Social History Narrative   She works at [employer] for many years, but had a promotion Nov 2018 and states wants to go down again, too stressed      Current Outpatient Medications:  .  Cholecalciferol (VITAMIN D) 50 MCG (2000 UT) CAPS, Take 1 capsule (2,000 Units total) by mouth daily., Disp: 30 capsule, Rfl: 0 .  escitalopram (LEXAPRO) 5 MG tablet, Take 1 tablet (5 mg total) by mouth daily., Disp: 30 tablet, Rfl: 0 .  hydrochlorothiazide (HYDRODIURIL) 12.5 MG tablet, Take 1 tablet (12.5 mg total) by mouth daily., Disp: 30 tablet, Rfl: 0 .  hydrOXYzine (ATARAX/VISTARIL) 25 MG tablet, Take 1 tablet (25 mg total) by mouth 3 (three) times daily as needed for anxiety. (Patient not taking: Reported on 07/17/2018), Disp: 20 tablet, Rfl: 0 .  metFORMIN (GLUCOPHAGE-XR) 500 MG 24 hr tablet, Take 1 tablet (500 mg total) by mouth daily with breakfast., Disp: 30 tablet, Rfl: 0  No Known Allergies  I personally reviewed active problem list, medication list, allergies, family history, social history with the patient/caregiver today.   ROS  Constitutional: Negative for fever or weight change.  Respiratory: Negative for cough and shortness of breath.   Cardiovascular: Negative for  chest pain ( at this time ) but palpitations.  Gastrointestinal: Negative for abdominal pain, no bowel changes.  Musculoskeletal: Negative for gait problem or joint swelling.  Skin: Negative for rash.  Neurological: Negative for dizziness or headache.  No other specific complaints in a complete review of systems (except as listed in HPI above).  Objective  Vitals:   07/17/18 0900  BP: (!) 180/100  Pulse: 88  Resp: 16  Temp: 97.8 F (36.6 C)  TempSrc: Oral  SpO2: 98%  Weight: 125 lb 12.8 oz (57.1 kg)  Height: 4' 10"  (1.473 m)    Body mass index is 26.29 kg/m.  Physical Exam  Constitutional: Patient appears well-developed and well-nourished. Overweight.  No distress.  HEENT: head atraumatic, normocephalic, pupils equal and reactive to light, neck supple, throat within normal limits Cardiovascular: Normal rate, regular rhythm and normal heart sounds.  No murmur heard. No BLE edema. Pulmonary/Chest: Effort normal and breath sounds normal. No respiratory distress. Abdominal: Soft.  There is no tenderness. Psychiatric: Patient has a normal mood and affect. behavior is normal. Judgment and thought content normal.  Recent Results (from the past 2160 hour(s))  Basic metabolic panel     Status: Abnormal   Collection Time: 07/11/18  1:54 PM  Result Value Ref Range   Sodium 136 135 - 145 mmol/L   Potassium 4.1 3.5 - 5.1 mmol/L    Comment: HEMOLYSIS AT THIS LEVEL MAY AFFECT RESULT   Chloride 104 98 - 111 mmol/L   CO2 24 22 - 32 mmol/L   Glucose, Bld 102 (H) 70 - 99 mg/dL   BUN 10 6 - 20 mg/dL   Creatinine, Ser 0.81 0.44 - 1.00 mg/dL   Calcium 8.9 8.9 - 10.3 mg/dL   GFR calc non Af Amer >60 >60 mL/min   GFR calc Af Amer >60 >60 mL/min   Anion gap 8 5 - 15    Comment: Performed at [facility], [address] [place], [place] [address]  CBC     Status: Abnormal   Collection Time: 07/11/18  1:54 PM  Result Value Ref Range   WBC 8.0 4.0 - 10.5 K/uL   RBC 5.34 (H) 3.87  - 5.11 MIL/uL   Hemoglobin 15.0 12.0 - 15.0 g/dL   HCT 44.6 36.0 - 46.0 %   MCV 83.5 80.0 - 100.0 fL   MCH 28.1 26.0 - 34.0 pg   MCHC 33.6 30.0 - 36.0 g/dL   RDW 12.4 11.5 - 15.5 %   Platelets 276 150 - 400 K/uL   nRBC 0.0 0.0 - 0.2 %    Comment: Performed at [facility], [address] [place], [place] [address]  Troponin I - ONCE - STAT     Status: None   Collection Time: 07/11/18  1:54 PM  Result Value Ref Range   Troponin I <0.03 <0.03 ng/mL    Comment: Performed at [facility], [address] [place], [place] [address]  Troponin I - ONCE - STAT     Status: None   Collection Time: 07/11/18  5:20 PM  Result Value Ref Range   Troponin I <0.03 <0.03 ng/mL    Comment: Performed at [facility], [place], [place] [address]     PHQ2/9: Depression screen [facility] 2/9 07/17/2018 02/25/2016 11/04/2015  Decreased Interest 0 0 0  Down, Depressed, Hopeless 0 0 0  PHQ - 2 Score 0 0 0  Altered sleeping 1 - -  Tired, decreased energy 0 - -  Change in appetite 0 - -  Feeling bad or failure about yourself  1 - -  Trouble concentrating 0 - -  Moving slowly or fidgety/restless 0 - -  Suicidal thoughts 0 - -  PHQ-9 Score 2 - -  Difficult doing work/chores Somewhat difficult - -   GAD 7 : Generalized Anxiety Score 07/17/2018 02/25/2016 11/04/2015  Nervous, Anxious, on Edge 2 0 3  Control/stop worrying 1 0 3  Worry too much - different  things 1 0 3  Trouble relaxing 2 0 3  Restless 1 0 1  Easily annoyed or irritable 1 0 3  Afraid - awful might happen 1 1 3   Total GAD 7 Score 9 1 19   Anxiety Difficulty Somewhat difficult Not difficult at all Very difficult    Fall Risk: Fall Risk  07/17/2018 02/25/2016 11/04/2015  Falls in the past year? 0 No No  Number falls in past yr: 0 - -  Injury with Fall? 0 - -    Assessment & Plan  1. Atypical chest pain  Resolved, likely from stress  2. Vitamin D deficiency  Resume vitamin D - Cholecalciferol  (VITAMIN D) 50 MCG (2000 UT) CAPS; Take 1 capsule (2,000 Units total) by mouth daily.  Dispense: 30 capsule; Refill: 0 - Vitamin D   3. Dyslipidemia  Recheck next visit  - lipid panel   4. Metabolic syndrome  - metFORMIN (GLUCOPHAGE-XR) 500 MG 24 hr tablet; Take 1 tablet (500 mg total) by mouth daily with breakfast.  Dispense: 30 tablet; Refill: 0 - hgbA1C  5. Overweight (BMI 25.0-29.9)   6. GAD (generalized anxiety disorder)  - escitalopram (LEXAPRO) 5 MG tablet; Take 1 tablet (5 mg total) by mouth daily.  Dispense: 30 tablet; Refill: 0  7. Uncontrolled hypertension  - hydrochlorothiazide (HYDRODIURIL) 12.5 MG tablet; Take 1 tablet (12.5 mg total) by mouth daily.  Dispense: 30 tablet; Refill: 0  - Comprehensive metabolic panel - CBC with Differential/Platelet - Microalbumin / creatinine urine ratio - TSH  8. Needs flu shot  refused  9. Need for Tdap vaccination  - Tdap vaccine greater than or equal to 7yo IM

## 2018-08-13 ENCOUNTER — Encounter: Payer: Self-pay | Admitting: Family Medicine

## 2018-08-13 DIAGNOSIS — E8881 Metabolic syndrome: Secondary | ICD-10-CM | POA: Insufficient documentation

## 2018-08-13 DIAGNOSIS — E785 Hyperlipidemia, unspecified: Secondary | ICD-10-CM | POA: Insufficient documentation

## 2018-08-13 DIAGNOSIS — R809 Proteinuria, unspecified: Secondary | ICD-10-CM | POA: Insufficient documentation

## 2018-08-13 LAB — CBC WITH DIFFERENTIAL/PLATELET
BASOS: 1 %
Basophils Absolute: 0.1 10*3/uL (ref 0.0–0.2)
EOS (ABSOLUTE): 0.5 10*3/uL — ABNORMAL HIGH (ref 0.0–0.4)
EOS: 6 %
HEMATOCRIT: 40.4 % (ref 34.0–46.6)
HEMOGLOBIN: 13.3 g/dL (ref 11.1–15.9)
Immature Grans (Abs): 0 10*3/uL (ref 0.0–0.1)
Immature Granulocytes: 1 %
Lymphocytes Absolute: 2.8 10*3/uL (ref 0.7–3.1)
Lymphs: 36 %
MCH: 28.3 pg (ref 26.6–33.0)
MCHC: 32.9 g/dL (ref 31.5–35.7)
MCV: 86 fL (ref 79–97)
MONOCYTES: 6 %
Monocytes Absolute: 0.5 10*3/uL (ref 0.1–0.9)
Neutrophils Absolute: 4 10*3/uL (ref 1.4–7.0)
Neutrophils: 50 %
Platelets: 269 10*3/uL (ref 150–450)
RBC: 4.7 x10E6/uL (ref 3.77–5.28)
RDW: 12.9 % (ref 11.7–15.4)
WBC: 7.8 10*3/uL (ref 3.4–10.8)

## 2018-08-13 LAB — COMPREHENSIVE METABOLIC PANEL
ALT: 17 IU/L (ref 0–32)
AST: 17 IU/L (ref 0–40)
Albumin/Globulin Ratio: 1.5 (ref 1.2–2.2)
Albumin: 4.5 g/dL (ref 3.8–4.8)
Alkaline Phosphatase: 38 IU/L — ABNORMAL LOW (ref 39–117)
BUN/Creatinine Ratio: 15 (ref 9–23)
BUN: 12 mg/dL (ref 6–24)
Bilirubin Total: 0.3 mg/dL (ref 0.0–1.2)
CALCIUM: 9.2 mg/dL (ref 8.7–10.2)
CO2: 21 mmol/L (ref 20–29)
CREATININE: 0.82 mg/dL (ref 0.57–1.00)
Chloride: 101 mmol/L (ref 96–106)
GFR calc Af Amer: 99 mL/min/{1.73_m2} (ref >59)
GFR, EST NON AFRICAN AMERICAN: 86 mL/min/{1.73_m2} (ref >59)
Globulin, Total: 3 g/dL (ref 1.5–4.5)
Glucose: 131 mg/dL — ABNORMAL HIGH (ref 65–99)
Potassium: 4.1 mmol/L (ref 3.5–5.2)
Sodium: 136 mmol/L (ref 134–144)
TOTAL PROTEIN: 7.5 g/dL (ref 6.0–8.5)

## 2018-08-13 LAB — LIPID PANEL
CHOLESTEROL TOTAL: 210 mg/dL — AB (ref 100–199)
Chol/HDL Ratio: 6.2 ratio — ABNORMAL HIGH (ref 0.0–4.4)
HDL: 34 mg/dL — ABNORMAL LOW (ref >39)
LDL Calculated: 101 mg/dL — ABNORMAL HIGH (ref 0–99)
Triglycerides: 376 mg/dL — ABNORMAL HIGH (ref 0–149)
VLDL Cholesterol Cal: 75 mg/dL — ABNORMAL HIGH (ref 5–40)

## 2018-08-13 LAB — MICROALBUMIN / CREATININE URINE RATIO
Creatinine, Urine: 72.2 mg/dL
Microalb/Creat Ratio: 182 mg/g creat — ABNORMAL HIGH (ref 0–29)
Microalbumin, Urine: 131.4 ug/mL

## 2018-08-13 LAB — VITAMIN D 25 HYDROXY (VIT D DEFICIENCY, FRACTURES): Vit D, 25-Hydroxy: 10 ng/mL — ABNORMAL LOW (ref 30.0–100.0)

## 2018-08-13 LAB — HEMOGLOBIN A1C
Est. average glucose Bld gHb Est-mCnc: 120 mg/dL
Hgb A1c MFr Bld: 5.8 % — ABNORMAL HIGH (ref 4.8–5.6)

## 2018-08-13 LAB — TSH: TSH: 1.17 u[IU]/mL (ref 0.450–4.500)

## 2018-08-21 ENCOUNTER — Ambulatory Visit: Payer: Managed Care, Other (non HMO) | Admitting: Family Medicine

## 2018-08-21 ENCOUNTER — Encounter: Payer: Self-pay | Admitting: Family Medicine

## 2018-08-21 VITALS — BP 140/100 | HR 85 | Temp 98.1°F | Resp 16 | Ht <= 58 in | Wt 125.5 lb

## 2018-08-21 DIAGNOSIS — E559 Vitamin D deficiency, unspecified: Secondary | ICD-10-CM

## 2018-08-21 DIAGNOSIS — F411 Generalized anxiety disorder: Secondary | ICD-10-CM

## 2018-08-21 DIAGNOSIS — I10 Essential (primary) hypertension: Secondary | ICD-10-CM

## 2018-08-21 DIAGNOSIS — Z20828 Contact with and (suspected) exposure to other viral communicable diseases: Secondary | ICD-10-CM

## 2018-08-21 DIAGNOSIS — E785 Hyperlipidemia, unspecified: Secondary | ICD-10-CM

## 2018-08-21 MED ORDER — VITAMIN D (ERGOCALCIFEROL) 1.25 MG (50000 UNIT) PO CAPS: 50000.0000 [IU] | ORAL_CAPSULE | ORAL | 0 refills | Status: DC

## 2018-08-21 MED ORDER — OSELTAMIVIR PHOSPHATE 75 MG PO CAPS: 75.0000 mg | ORAL_CAPSULE | Freq: Every day | ORAL | 0 refills | Status: DC

## 2018-08-21 NOTE — Progress Notes (Signed)
Name: Kathleen Villanueva   MRN: [id number]    DOB: 11-24-1970   Date:08/21/2018       Progress Note  Subjective  Chief Complaint  Chief Complaint  Patient presents with  . Hypertension    she thinks that BP medication is to strong for her. When she took it she felt faint, face turned white, unable to concentrate. She has not taken any since then.  . Influenza    Her son tested positive for flu and she would like Tamiflu  . Vitamin D    Prescription was not sent to pharamacy for vitamin D.    HPI  Panic attack / GAD: she is taking lexapro 5 mg daily, this week she switched positions at work, stepped down and is feeling much better, no longer using hydroxizine and no episodes of chest pain since. Advised to stay on lexapro for 6 months and we will get her off this Summer.   Vitamin D: she needs rx sent to pharmacy  HTN: we gave her hctz and she states the day following the first dose she felt flushed, dizzy, unable to focus and her face was white, bp was checked at work and per her friend it was normal ( not sure of the value), she stopped taking medication since. BP is elevated today, advised to try going back on half pill and see if symptoms recurs. Episode happened over 12 hours after the first dose  Patient Active Problem List   Diagnosis Date Noted  . Metabolic syndrome 70/17/7939  . Dyslipidemia 08/13/2018  . Microalbuminuria 08/13/2018  . GAD (generalized anxiety disorder) 08/25/2016  . Allergy to other foods 11/25/2015  . H/O renal calculi 11/25/2015  . Vitamin D deficiency 11/25/2015  . Hyperlipidemia with low HDL 11/25/2015  . Overweight (BMI 25.0-29.9) 11/04/2015  . Seasonal allergies 11/04/2015  . Chronic constipation 11/04/2015  . Other bursal cyst, left ankle and foot 11/04/2015    Past Surgical History:  Procedure Laterality Date  . AUGMENTATION MAMMAPLASTY Bilateral 07/03/2004  . BREAST ENHANCEMENT SURGERY Bilateral   . COLPOSCOPY  07/1995  . CYSTOSCOPY  2012  .  DIAGNOSTIC LAPAROSCOPY  04/13/2000   for chronic RLQ pain; normal anatomy  . EXTRACORPOREAL SHOCK WAVE LITHOTRIPSY    . 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 TUBAL LIGATION      Family History  Problem Relation Age of Onset  . Cancer Mother        cervical  . Cancer Maternal Uncle        Kidney  . Heart attack Father   . Hypertension Father   . Hypercholesterolemia Father   . Gout Father   . Gout Brother   . Breast cancer Cousin 24    Social History   Socioeconomic History  . Marital status: Married    Spouse name: Not on file  . Number of children: 3  . Years of education: Not on file  . Highest education level: Not on file  Occupational History  . Occupation: Management consultant     Comment: lab [employer]  . Financial resource strain: Not hard at all  . Food insecurity:    Worry: Never true    Inability: Never true  . Transportation needs:    Medical: No    Non-medical: No  Tobacco Use  . Smoking status: Never Smoker  . Smokeless tobacco: Never Used  Substance and Sexual Activity  . Alcohol use: No    Alcohol/week: 0.0  standard drinks  . Drug use: No  . Sexual activity: Yes    Partners: Male    Birth control/protection: Surgical  Lifestyle  . Physical activity:    Days per week: 0 days    Minutes per session: 0 min  . Stress: Very much  Relationships  . Social connections:    Talks on phone: Not on file    Gets together: Not on file    Attends religious service: Not on file    Active member of club or organization: Not on file    Attends meetings of clubs or organizations: Not on file    Relationship status: Not on file  . Intimate partner violence:    Fear of current or ex partner: No    Emotionally abused: No    Physically abused: No    Forced sexual activity: No  Other Topics Concern  . Not on file  Social History Narrative   She works at [employer] for many years, but had a promotion Nov 2018 and states wants to go down  again, too stressed      Current Outpatient Medications:  .  Cholecalciferol (VITAMIN D) 50 MCG (2000 UT) CAPS, Take 1 capsule (2,000 Units total) by mouth daily., Disp: 30 capsule, Rfl: 0 .  escitalopram (LEXAPRO) 5 MG tablet, Take 1 tablet (5 mg total) by mouth daily., Disp: 30 tablet, Rfl: 0 .  hydrochlorothiazide (HYDRODIURIL) 12.5 MG tablet, Take 1 tablet (12.5 mg total) by mouth daily., Disp: 30 tablet, Rfl: 0 .  hydrOXYzine (ATARAX/VISTARIL) 25 MG tablet, Take 1 tablet (25 mg total) by mouth 3 (three) times daily as needed for anxiety., Disp: 20 tablet, Rfl: 0 .  metFORMIN (GLUCOPHAGE-XR) 500 MG 24 hr tablet, Take 1 tablet (500 mg total) by mouth daily with breakfast., Disp: 30 tablet, Rfl: 0  No Known Allergies  I personally reviewed active problem list, medication list, allergies, family history with the patient/caregiver today.   ROS  Ten systems reviewed and is negative except as mentioned in HPI   Objective  Vitals:   08/21/18 1442  BP: (!) 160/108  Pulse: 85  Resp: 16  Temp: 98.1 F (36.7 C)  TempSrc: Oral  SpO2: 98%  Weight: 125 lb 8 oz (56.9 kg)  Height: 4' 10"  (1.473 m)    Body mass index is 26.23 kg/m.  Physical Exam  Constitutional: Patient appears well-developed and well-nourished. Overweight.  No distress.  HEENT: head atraumatic, normocephalic, pupils equal and reactive to light, neck supple, throat within normal limits Cardiovascular: Normal rate, regular rhythm and normal heart sounds.  No murmur heard. No BLE edema. Pulmonary/Chest: Effort normal and breath sounds normal. No respiratory distress. Abdominal: Soft.  There is no tenderness. Psychiatric: Patient has a normal mood and affect. behavior is normal. Judgment and thought content normal.  Recent Results (from the past 2160 hour(s))  Basic metabolic panel     Status: Abnormal   Collection Time: 07/11/18  1:54 PM  Result Value Ref Range   Sodium 136 135 - 145 mmol/L   Potassium 4.1 3.5 -  5.1 mmol/L    Comment: HEMOLYSIS AT THIS LEVEL MAY AFFECT RESULT   Chloride 104 98 - 111 mmol/L   CO2 24 22 - 32 mmol/L   Glucose, Bld 102 (H) 70 - 99 mg/dL   BUN 10 6 - 20 mg/dL   Creatinine, Ser 0.81 0.44 - 1.00 mg/dL   Calcium 8.9 8.9 - 10.3 mg/dL   GFR calc non Af Amer >  60 >60 mL/min   GFR calc Af Amer >60 >60 mL/min   Anion gap 8 5 - 15    Comment: Performed at [facility], [address] [place], [place] [address]  CBC     Status: Abnormal   Collection Time: 07/11/18  1:54 PM  Result Value Ref Range   WBC 8.0 4.0 - 10.5 K/uL   RBC 5.34 (H) 3.87 - 5.11 MIL/uL   Hemoglobin 15.0 12.0 - 15.0 g/dL   HCT 44.6 36.0 - 46.0 %   MCV 83.5 80.0 - 100.0 fL   MCH 28.1 26.0 - 34.0 pg   MCHC 33.6 30.0 - 36.0 g/dL   RDW 12.4 11.5 - 15.5 %   Platelets 276 150 - 400 K/uL   nRBC 0.0 0.0 - 0.2 %    Comment: Performed at [facility], [address] [place], [place] [address]  Troponin I - ONCE - STAT     Status: None   Collection Time: 07/11/18  1:54 PM  Result Value Ref Range   Troponin I <0.03 <0.03 ng/mL    Comment: Performed at [facility], [address] [place], [place] [address]  Troponin I - ONCE - STAT     Status: None   Collection Time: 07/11/18  5:20 PM  Result Value Ref Range   Troponin I <0.03 <0.03 ng/mL    Comment: Performed at [facility], [address] [place], [place] [address]  VITAMIN D 25 Hydroxy (Vit-D Deficiency, Fractures)     Status: Abnormal   Collection Time: 08/12/18  8:10 AM  Result Value Ref Range   Vit D, 25-Hydroxy 10.0 (L) 30.0 - 100.0 ng/mL    Comment: Vitamin D deficiency has been defined by the [place] practice guideline as a level of serum 25-OH vitamin D less than 20 ng/mL (1,2). The Endocrine Society went on to further define vitamin D insufficiency as a level between 21 and 29 ng/mL (2). 1. IOM (Institute of Medicine). 2010. Dietary reference    intakes for  calcium and D. [place]: The    [employer]. 2. Holick MF, Binkley [place], Bischoff-Ferrari HA, et al.    Evaluation, treatment, and prevention of vitamin D    deficiency: an Endocrine Society clinical practice    guideline. JCEM. 2011 Jul; 96(7):1911-30.   Comprehensive metabolic panel     Status: Abnormal   Collection Time: 08/12/18  8:10 AM  Result Value Ref Range   Glucose 131 (H) 65 - 99 mg/dL   BUN 12 6 - 24 mg/dL   Creatinine, Ser 0.82 0.57 - 1.00 mg/dL   GFR calc non Af Amer 86 >59 mL/min/1.73   GFR calc Af Amer 99 >59 mL/min/1.73   BUN/Creatinine Ratio 15 9 - 23   Sodium 136 134 - 144 mmol/L   Potassium 4.1 3.5 - 5.2 mmol/L   Chloride 101 96 - 106 mmol/L   CO2 21 20 - 29 mmol/L   Calcium 9.2 8.7 - 10.2 mg/dL   Total Protein 7.5 6.0 - 8.5 g/dL   Albumin 4.5 3.8 - 4.8 g/dL    Comment:               **Please note reference interval change**   Globulin, Total 3.0 1.5 - 4.5 g/dL   Albumin/Globulin Ratio 1.5 1.2 - 2.2   Bilirubin Total 0.3 0.0 - 1.2 mg/dL   Alkaline Phosphatase 38 (L) 39 - 117 IU/L  AST 17 0 - 40 IU/L   ALT 17 0 - 32 IU/L  CBC with Differential/Platelet     Status: Abnormal   Collection Time: 08/12/18  8:10 AM  Result Value Ref Range   WBC 7.8 3.4 - 10.8 x10E3/uL   RBC 4.70 3.77 - 5.28 x10E6/uL   Hemoglobin 13.3 11.1 - 15.9 g/dL   Hematocrit 40.4 34.0 - 46.6 %   MCV 86 79 - 97 fL   MCH 28.3 26.6 - 33.0 pg   MCHC 32.9 31.5 - 35.7 g/dL   RDW 12.9 11.7 - 15.4 %   Platelets 269 150 - 450 x10E3/uL   Neutrophils 50 Not Estab. %   Lymphs 36 Not Estab. %   Monocytes 6 Not Estab. %   Eos 6 Not Estab. %   Basos 1 Not Estab. %   Neutrophils Absolute 4.0 1.4 - 7.0 x10E3/uL   Lymphocytes Absolute 2.8 0.7 - 3.1 x10E3/uL   Monocytes Absolute 0.5 0.1 - 0.9 x10E3/uL   EOS (ABSOLUTE) 0.5 (H) 0.0 - 0.4 x10E3/uL   Basophils Absolute 0.1 0.0 - 0.2 x10E3/uL   Immature Granulocytes 1 Not Estab. %   Immature Grans (Abs) 0.0 0.0 - 0.1 x10E3/uL   Hemoglobin A1c     Status: Abnormal   Collection Time: 08/12/18  8:10 AM  Result Value Ref Range   Hgb A1c MFr Bld 5.8 (H) 4.8 - 5.6 %    Comment:          Prediabetes: 5.7 - 6.4          Diabetes: >6.4          Glycemic control for adults with diabetes: <7.0    Est. average glucose Bld gHb Est-mCnc 120 mg/dL  Lipid panel     Status: Abnormal   Collection Time: 08/12/18  8:10 AM  Result Value Ref Range   Cholesterol, Total 210 (H) 100 - 199 mg/dL   Triglycerides 376 (H) 0 - 149 mg/dL   HDL 34 (L) >39 mg/dL   VLDL Cholesterol Cal 75 (H) 5 - 40 mg/dL   LDL Calculated 101 (H) 0 - 99 mg/dL   Chol/HDL Ratio 6.2 (H) 0.0 - 4.4 ratio    Comment:                                   T. Chol/HDL Ratio                                             Men  Women                               1/2 Avg.Risk  3.4    3.3                                   Avg.Risk  5.0    4.4                                2X Avg.Risk  9.6    7.1  3X Avg.Risk 23.4   11.0   Microalbumin / creatinine urine ratio     Status: Abnormal   Collection Time: 08/12/18  8:10 AM  Result Value Ref Range   Creatinine, Urine 72.2 Not Estab. mg/dL   Microalbumin, Urine 131.4 Not Estab. ug/mL   Microalb/Creat Ratio 182 (H) 0 - 29 mg/g creat    Comment:                        Normal:                0 -  29                        Moderately increased: 30 - 300                        Severely increased:       >300               **Please note reference interval change**   TSH     Status: None   Collection Time: 08/12/18  8:16 AM  Result Value Ref Range   TSH 1.170 0.450 - 4.500 uIU/mL      PHQ2/9: Depression screen [facility] 2/9 07/17/2018 02/25/2016 11/04/2015  Decreased Interest 0 0 0  Down, Depressed, Hopeless 0 0 0  PHQ - 2 Score 0 0 0  Altered sleeping 1 - -  Tired, decreased energy 0 - -  Change in appetite 0 - -  Feeling bad or failure about yourself  1 - -  Trouble concentrating 0 - -  Moving  slowly or fidgety/restless 0 - -  Suicidal thoughts 0 - -  PHQ-9 Score 2 - -  Difficult doing work/chores Somewhat difficult - -     Fall Risk: Fall Risk  08/21/2018 07/17/2018 02/25/2016 11/04/2015  Falls in the past year? 0 0 No No  Number falls in past yr: 0 0 - -  Injury with Fall? 0 0 - -     Functional Status Survey: Is the patient deaf or have difficulty hearing?: No Does the patient have difficulty seeing, even when wearing glasses/contacts?: No Does the patient have difficulty concentrating, remembering, or making decisions?: No Does the patient have difficulty walking or climbing stairs?: No Does the patient have difficulty dressing or bathing?: No Does the patient have difficulty doing errands alone such as visiting a doctor's office or shopping?: No   Assessment & Plan   1. Uncontrolled hypertension  Advised to go back on half pill of hctz, she thinks she is nervous coming in, bp at work is back to normal   2. Vitamin D deficiency  - Vitamin D, Ergocalciferol, (DRISDOL) 1.25 MG (50000 UT) CAPS capsule; Take 1 capsule (50,000 Units total) by mouth every 7 (seven) days.  Dispense: 12 capsule; Refill: 0  3. GAD (generalized anxiety disorder)  She is doing better on medication, and also changed job position  4. Exposure to the flu  Explained medication is not necessary  - oseltamivir (TAMIFLU) 75 MG capsule; Take 1 capsule (75 mg total) by mouth daily.  Dispense: 10 capsule; Refill: 0   5. Dyslipidemia  Discussed importance of life style modificaiton

## 2018-08-22 ENCOUNTER — Telehealth: Payer: Self-pay | Admitting: Family Medicine

## 2018-08-22 DIAGNOSIS — E559 Vitamin D deficiency, unspecified: Secondary | ICD-10-CM

## 2018-08-22 MED ORDER — VITAMIN D (ERGOCALCIFEROL) 1.25 MG (50000 UNIT) PO CAPS: 50000.0000 [IU] | ORAL_CAPSULE | ORAL | 0 refills | Status: DC

## 2018-08-22 NOTE — Telephone Encounter (Signed)
Copied from [place] [phone]. Topic: Quick Communication - Rx Refill/Question >> Aug 22, 2018  9:33 AM Virl Axe D wrote: Medication: Vitamin D, Ergocalciferol, (DRISDOL) 1.25 MG (50000 UT) CAPS capsule / Pt needs rx sent to [facility]. [facility] unable to fill.  Has the patient contacted their pharmacy? Yes.   (Agent: If no, request that the patient contact the pharmacy for the refill.) (Agent: If yes, when and what did the pharmacy advise?)  Preferred Pharmacy (with phone number or street name): [id number] - [place], [place] - [place] [phone] (Phone) [phone] (Fax)  Agent: Please be advised that RX refills may take up to 3 business days. We ask that you follow-up with your pharmacy.

## 2018-09-06 ENCOUNTER — Ambulatory Visit: Payer: Self-pay | Admitting: *Deleted

## 2018-09-06 ENCOUNTER — Ambulatory Visit: Payer: Self-pay

## 2018-09-06 ENCOUNTER — Ambulatory Visit: Payer: Self-pay | Admitting: Family Medicine

## 2018-09-06 NOTE — Telephone Encounter (Signed)
Message from Luciana Axe sent at 09/06/2018 1:14 PM EST   Patient states that she has a sinus problems. What can she take over the counter? Please advise.

## 2018-09-06 NOTE — Telephone Encounter (Signed)
Left message for pt to return her call to [place].

## 2018-09-06 NOTE — Telephone Encounter (Signed)
See triage note.

## 2018-09-06 NOTE — Telephone Encounter (Signed)
Incoming  Call from Patient with complaint of sneezing sinus preesure and  congestion.   Denies  Fever.  Denies  red eyes, watery  Itchy. Denies wheezing.  Denies pregnancy.  Recommended Claritin,  Zyrtec. And  Nasal, washes.  Patient voices understanding.  Patient  States if no relief will call back for appointment.   Reason for Disposition . [1] Nasal allergies AND [5] only certain times of year (hay fever)  Answer Assessment - Initial Assessment Questions 1. SYMPTOM: "What's the main symptom you're concerned about?" (e.g., runny nose, stuffiness, sneezing, itching)     Stuffiness,sneezing 2. SEVERITY: "How bad is it?" "What does it keep you from doing?" (e.g., sleeping, working)      3. EYES: "Are the eyes also red, watery, and itchy?"      denies 4. TRIGGER: "What pollen or other allergic substance do you think is causing the symptoms?"       5. TREATMENT: "What medicine are you using?" "What medicine worked best in the past?"     noner 6. OTHER SYMPTOMS: "Do you have any other symptoms?" (e.g., coughing, difficulty breathing, wheezing)     denies 7. PREGNANCY: "Is there any chance you are pregnant?" "When was your last menstrual period?"     denies  Protocols used: NASAL ALLERGIES (HAY FEVER)-A-AH

## 2018-10-01 ENCOUNTER — Ambulatory Visit: Payer: Managed Care, Other (non HMO) | Admitting: Certified Nurse Midwife

## 2018-12-01 NOTE — Progress Notes (Addendum)
Gynecology Annual Exam  PCP: Steele Sizer, MD  Chief Complaint:  Chief Complaint  Patient presents with  . Gynecologic Exam    No complaints    History of Present Illness: Patient is a 48 y.o. [id number] Asian female who presents for her annual gyn exam. The patient has no gyn complaints today.  LMP: Patient's last menstrual period was 11/16/2018. Menses are monthly, lasting 7 days with 1 heavier days requiring pad changes q 1-2 hours. Has noticed clots overnight on pad that are quarter size.Marland Kitchen Has dysmenorrhea with heavier flow that is relieved with Advil 200 mgm x1. Her current form of contraception is a BTL. Since her last annual 09/26/2017, she has had some problems with hypertension and anxiety which she attributes to a stressful position she was promoted to at work. Her anxiety and elevated blood pressure have both improved since going back to her old position. She has not been taking HCTZ that was prescribed by her PCP because her blood pressures are normal at home. She was prescribed Lexapro and Atarax for anxiety. Has stopped taking Lexapro on a daily basis and takes it occasionally when she feels stressed. Advised her that Lexapro needs to be taken daily to be effective. She can take Atarax prn for anxiety or sleep. Her vitamin D3 level was low and she is taking 50, 000 IU weekly.   Does not get adequate calcium in her diet. Occasionally eats feta cheese and gets some spinach and broccoli in her diet. Has stopped taking Metformin for metabolic syndrome/ hyperglycemia due to stomach upset. She exercises by walking. She does not smoke or drink alcohol.  Her last Pap smear was 09/26/2017 and was NIL/ negative HRHPV. Remote hx of CIN1 treated with cryo 1997 Her last mammogram was 07/05/2018 and was negative. She does monthly SBEs. She has had breast augmentation surgery. There is a family history of breast cancer in her maternal cousin at age 30. Genetic testing has not been done. There is  no family history of ovarian cancer in her family.  Her screening for cholesterol and diabetes has been done by her PCP 08/13/2018. She has had elevated triglycerides, and has been told to watch her fried foods and sweets.   Review of Systems: Review of Systems  Constitutional: Negative for chills, fever and weight loss.  HENT: Negative for congestion, sinus pain and sore throat.   Eyes: Negative for blurred vision and pain.  Respiratory: Negative for hemoptysis, shortness of breath and wheezing.   Cardiovascular: Negative for chest pain, palpitations and leg swelling.  Gastrointestinal: Negative for abdominal pain, blood in stool, diarrhea, heartburn, nausea and vomiting.  Genitourinary: Negative for dysuria, frequency, hematuria and urgency.  Musculoskeletal: Negative for back pain, joint pain and myalgias.  Skin: Negative for itching and rash.  Neurological: Negative for dizziness, tingling and headaches.  Endo/Heme/Allergies: Positive for environmental allergies (positive for sneezing). Negative for polydipsia. Does not bruise/bleed easily.       Negative for hirsutism   Psychiatric/Behavioral: Negative for depression. The patient is not nervous/anxious and does not have insomnia.     Past Medical History:  Past Medical History:  Diagnosis Date  . Allergy   . Anxiety   . History of gestational diabetes 2006/ 2009   with second and third pregnancy  . History of nephrolithiasis    seen by Dr. Jacqlyn Larsen Urologist   . History of pre-eclampsia    with all pregnancies  . Hypertension   . Over weight   .  Sprain of unspecified ligament of left ankle, sequela     Past Surgical History:  Past Surgical History:  Procedure Laterality Date  . AUGMENTATION MAMMAPLASTY Bilateral 07/03/2004  . BREAST ENHANCEMENT SURGERY Bilateral   . COLPOSCOPY  07/1995  . CYSTOSCOPY  2012  . DIAGNOSTIC LAPAROSCOPY  04/13/2000   for chronic RLQ pain; normal anatomy  . EXTRACORPOREAL SHOCK WAVE  LITHOTRIPSY    . 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 TUBAL LIGATION       Obstetric History: [id number]  Family History:  Family History  Problem Relation Age of Onset  . Cancer Mother        cervical  . Cancer Maternal Uncle        Kidney  . Heart attack Father   . Hypertension Father   . Hypercholesterolemia Father   . Gout Father   . Gout Brother   . Breast cancer Cousin 69    Social History:  Social History   Socioeconomic History  . Marital status: Married    Spouse name: Not on file  . Number of children: 3  . Years of education: Not on file  . Highest education level: Not on file  Occupational History  . Occupation: Management consultant     Comment: lab [place]  . Financial resource strain: Not hard at all  . Food insecurity:    Worry: Never true    Inability: Never true  . Transportation needs:    Medical: No    Non-medical: No  Tobacco Use  . Smoking status: Never Smoker  . Smokeless tobacco: Never Used  Substance and Sexual Activity  . Alcohol use: No    Alcohol/week: 0.0 standard drinks  . Drug use: No  . Sexual activity: Yes    Partners: Male    Birth control/protection: Surgical  Lifestyle  . Physical activity:    Days per week: 0 days    Minutes per session: 0 min  . Stress: Very much  Relationships  . Social connections:    Talks on phone: Not on file    Gets together: Not on file    Attends religious service: Not on file    Active member of club or organization: Not on file    Attends meetings of clubs or organizations: Not on file    Relationship status: Not on file  . Intimate partner violence:    Fear of current or ex partner: No    Emotionally abused: No    Physically abused: No    Forced sexual activity: No  Other Topics Concern  . Not on file  Social History Narrative   She works at [employer] for many years, but had a promotion Nov 2018 and states wants to go down again, too stressed    OB History   Gravida Para Term Preterm AB Living  _0 SAB TAB Ectopic Multiple Live Births  2       3    # Outcome Date GA Lbr Len/2nd Weight Sex Delivery Anes PTL Lv  5 Preterm 12/17/07 [redacted]w[redacted]d 5 lb 12 oz (2.608 kg) F Vag-Spont  N LIV     Birth Comments: increased Down syndrome risk-normal chromosomes on amniocentesis.     Complications: Preeclampsia, Gestational diabetes  4 Preterm 05/13/05 [id number] lb 5 oz (2.41 kg) M Vag-Spont  N LIV     Birth Comments: IOL for severe preeclampsia  Complications: Severe preeclampsia, Gestational diabetes  3 Term 02/05/95 [redacted]w[redacted]d 6 lb 6 oz (2.892 kg) M Vag-Spont   LIV     Complications: Chorioamnionitis, Hypertension in pregnancy, preeclampsia, severe, delivered/postpartum  2 SAB      SAB     1 SAB             Obstetric Comments  All labors were induced for preeclampsia-received magnesium sulfate with G1 and G2   Allergies:  No Known Allergies  Medications: none  Physical Exam Vitals: BP 140/90 (BP Location: Right Arm, Patient Position: Sitting, Cuff Size: Normal)   Pulse 90   Ht 4' 9" (1.448 m)   Wt 129 lb (58.5 kg)   LMP 11/16/2018   BMI 27.92 kg/m  General: Asian female in NAD HEENT: normocephalic, anicteric Thyroid: no enlargement, no palpable nodules Pulmonary: No increased work of breathing, CTAB Cardiovascular: RRR without murmur Breast: Breast symmetrical, s/p breast augmentation, no tenderness, no palpable nodules or masses, no skin or nipple retraction present, no nipple discharge.  No axillary, infraclavicular,  or supraclavicular lymphadenopathy. Abdomen: soft, non-tender, non-distended.  Umbilicus without lesions.  No hepatomegaly, or masses palpable. No evidence of hernia  Genitourinary:  External: Normal external female genitalia.  Normal urethral meatus, normal Bartholin's and Skene's glands.    Vagina: Normal vaginal mucosa, no evidence of prolapse.  Cervix: Grossly normal in appearance, not friable  Uterus: AV, NSSC,   mobile, NT  Adnexa: ovaries non-enlarged, no adnexal masses  Rectal: deferred  Lymphatic: no evidence of inguinal lymphadenopathy Extremities: no edema, erythema, or tenderness Neurologic: Grossly intact Psychiatric: mood appropriate, affect full    Assessment: Well woman exam Elevated blood pressure/ hypertension GAD  Plan:  1) Mammogram  - recommend yearly [facility]  2) Pap smear - Pap done  3) Osteoporosis prevention  Discussed calcium and vitamin D3 requirements. Will switch to 2000 IU vitamin D3 after completing her RX  4) Routine healthcare maintenance including cholesterol, diabetes screening thru her PCP. Recommend follow up with PCP to discuss treatment for hypertension and anxiety. Encouraged decreased salt in diet. Also recommended keeping blood pressure log at home daily.  5) Colon cancer screening: Discussed options including colonoscopy, annual FIT testing or Cologuard. She desires FIT testing. Given home collection kit and instructions on how to collect.   6) Follow up 1 year for routine annual  CDalia [place]ading [place]

## 2018-12-02 ENCOUNTER — Ambulatory Visit (INDEPENDENT_AMBULATORY_CARE_PROVIDER_SITE_OTHER): Payer: Managed Care, Other (non HMO) | Admitting: Certified Nurse Midwife

## 2018-12-02 ENCOUNTER — Encounter: Payer: Self-pay | Admitting: Certified Nurse Midwife

## 2018-12-02 ENCOUNTER — Other Ambulatory Visit: Payer: Self-pay

## 2018-12-02 VITALS — BP 140/90 | HR 90 | Ht <= 58 in | Wt 129.0 lb

## 2018-12-02 DIAGNOSIS — Z1211 Encounter for screening for malignant neoplasm of colon: Secondary | ICD-10-CM

## 2018-12-02 DIAGNOSIS — Z01419 Encounter for gynecological examination (general) (routine) without abnormal findings: Secondary | ICD-10-CM | POA: Diagnosis not present

## 2018-12-02 DIAGNOSIS — Z124 Encounter for screening for malignant neoplasm of cervix: Secondary | ICD-10-CM

## 2018-12-03 ENCOUNTER — Encounter: Payer: Self-pay | Admitting: Certified Nurse Midwife

## 2018-12-06 ENCOUNTER — Other Ambulatory Visit: Payer: Self-pay | Admitting: Certified Nurse Midwife

## 2018-12-11 LAB — IGP,RFX APTIMA HPV ALL PTH

## 2018-12-11 LAB — HPV APTIMA: HPV Aptima: NEGATIVE

## 2018-12-13 ENCOUNTER — Encounter: Payer: Self-pay | Admitting: Certified Nurse Midwife

## 2018-12-18 LAB — FECAL OCCULT BLOOD, IMMUNOCHEMICAL: Fecal Occult Bld: NEGATIVE

## 2019-02-21 ENCOUNTER — Encounter: Payer: Self-pay | Admitting: Family Medicine

## 2019-02-21 ENCOUNTER — Ambulatory Visit: Payer: Managed Care, Other (non HMO) | Admitting: Family Medicine

## 2019-02-21 ENCOUNTER — Other Ambulatory Visit: Payer: Self-pay

## 2019-02-21 VITALS — BP 160/100 | HR 98 | Temp 97.3°F | Resp 16 | Ht <= 58 in | Wt 127.2 lb

## 2019-02-21 DIAGNOSIS — I10 Essential (primary) hypertension: Secondary | ICD-10-CM

## 2019-02-21 DIAGNOSIS — E559 Vitamin D deficiency, unspecified: Secondary | ICD-10-CM | POA: Diagnosis not present

## 2019-02-21 DIAGNOSIS — E785 Hyperlipidemia, unspecified: Secondary | ICD-10-CM

## 2019-02-21 DIAGNOSIS — E8881 Metabolic syndrome: Secondary | ICD-10-CM

## 2019-02-21 DIAGNOSIS — F411 Generalized anxiety disorder: Secondary | ICD-10-CM | POA: Diagnosis not present

## 2019-02-21 MED ORDER — METFORMIN HCL ER 500 MG PO TB24: 500.0000 mg | ORAL_TABLET | Freq: Every day | ORAL | 1 refills | Status: DC

## 2019-02-21 MED ORDER — LISINOPRIL-HYDROCHLOROTHIAZIDE 20-12.5 MG PO TABS: 1.0000 | ORAL_TABLET | Freq: Every day | ORAL | 0 refills | Status: DC

## 2019-02-21 MED ORDER — VITAMIN D (ERGOCALCIFEROL) 1.25 MG (50000 UNIT) PO CAPS: 50000.0000 [IU] | ORAL_CAPSULE | ORAL | 0 refills | Status: DC

## 2019-02-21 NOTE — Progress Notes (Signed)
Name: Kathleen Villanueva   MRN: [id number]    DOB: June 10, 1971   Date:02/21/2019       Progress Note  Subjective  Chief Complaint  Chief Complaint  Patient presents with  . Medication Refill    6 month FU  . Hypertension  . Panic Attack/GAD  . Vitamin D    HPI  HTN: she stopped medication when she ran out of hctz, explained that once we start her on bp medication she will take it for a life time. She denies chest pain or palpitation . Urine microalbuminuria. , explained that if high urine micro and if bp spikes with new medication we will check kidney US   Metabolic syndrome: D3U was 5.8% on her last visit, she has changed her diet , avoiding sweet beverages and bread and taking metformin, she will increase physical activity.   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   Patient Active Problem List   Diagnosis Date Noted  . Metabolic syndrome 20/25/4270  . Dyslipidemia 08/13/2018  . Microalbuminuria 08/13/2018  . GAD (generalized anxiety disorder) 08/25/2016  . Allergy to other foods 11/25/2015  . H/O renal calculi 11/25/2015  . Vitamin D deficiency 11/25/2015  . Hyperlipidemia with low HDL 11/25/2015  . Overweight (BMI 25.0-29.9) 11/04/2015  . Seasonal allergies 11/04/2015  . Chronic constipation 11/04/2015  . Other bursal cyst, left ankle and foot 11/04/2015    Past Surgical History:  Procedure Laterality Date  . AUGMENTATION MAMMAPLASTY Bilateral 07/03/2004  . BREAST ENHANCEMENT SURGERY Bilateral   . COLPOSCOPY  07/1995  . CYSTOSCOPY  2012  . DIAGNOSTIC LAPAROSCOPY  04/13/2000   for chronic RLQ pain; normal anatomy  . EXTRACORPOREAL SHOCK WAVE LITHOTRIPSY    . 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 TUBAL LIGATION      Family History  Problem Relation Age of Onset  . Cancer Mother        cervical  . Cancer Maternal Uncle        Kidney  . Heart  attack Father   . Hypertension Father   . Hypercholesterolemia Father   . Gout Father   . Gout Brother   . Breast cancer Cousin 42    Social History   Socioeconomic History  . Marital status: Married    Spouse name: Not on file  . Number of children: 3  . Years of education: Not on file  . Highest education level: Not on file  Occupational History  . Occupation: Management consultant     Comment: lab [place]  . Financial resource strain: Not hard at all  . Food insecurity    Worry: Never true    Inability: Never true  . Transportation needs    Medical: No    Non-medical: No  Tobacco Use  . Smoking status: Never Smoker  . Smokeless tobacco: Never Used  Substance and Sexual Activity  . Alcohol use: No    Alcohol/week: 0.0 standard drinks  . Drug use: No  . Sexual activity: Yes    Partners: Male    Birth control/protection: Surgical  Lifestyle  . Physical activity    Days per week: 0 days    Minutes per session: 0 min  . Stress: Very much  Relationships  . Social connections    Talks on phone: More than three times a week  Gets together: More than three times a week    Attends religious service: Never    Active member of club or organization: No    Attends meetings of clubs or organizations: Never    Relationship status: Married  . Intimate partner violence    Fear of current or ex partner: No    Emotionally abused: No    Physically abused: No    Forced sexual activity: No  Other Topics Concern  . Not on file  Social History Narrative   She works at [employer] for many years, but had a promotion Nov 2018 and states wants to go down again, too stressed      Current Outpatient Medications:  .  amoxicillin (AMOXIL) 500 MG capsule, TAKE 1 CAPSULE BY MOUTH EVERY 8 HOURS UNTIL FINISHED, Disp: , Rfl:  .  chlorhexidine (PERIDEX) 0.12 % solution, PLEASE SEE ATTACHED FOR DETAILED DIRECTIONS, Disp: , Rfl:  .  ibuprofen (ADVIL) 400 MG tablet, , Disp: ,  Rfl:  .  metFORMIN (GLUCOPHAGE-XR) 500 MG 24 hr tablet, Take 1 tablet (500 mg total) by mouth daily with breakfast., Disp: 90 tablet, Rfl: 1 .  Vitamin D, Ergocalciferol, (DRISDOL) 1.25 MG (50000 UT) CAPS capsule, Take 1 capsule (50,000 Units total) by mouth every 7 (seven) days., Disp: 12 capsule, Rfl: 0 .  Cholecalciferol (VITAMIN D) 50 MCG (2000 UT) CAPS, Take 1 capsule (2,000 Units total) by mouth daily. (Patient not taking: Reported on 02/21/2019), Disp: 30 capsule, Rfl: 0 .  lisinopril-hydrochlorothiazide (ZESTORETIC) 20-12.5 MG tablet, Take 1 tablet by mouth daily., Disp: 30 tablet, Rfl: 0  No Known Allergies  I personally reviewed active problem list, medication list, allergies, family history, social history with the patient/caregiver today.   ROS  Constitutional: Negative for fever or weight change.  Respiratory: Negative for cough and shortness of breath.   Cardiovascular: Negative for chest pain or palpitations.  Gastrointestinal: Negative for abdominal pain, no bowel changes.  Musculoskeletal: Negative for gait problem or joint swelling.  Skin: Negative for rash.  Neurological: Negative for dizziness or headache.  No other specific complaints in a complete review of systems (except as listed in HPI above).   Objective  Vitals:   02/21/19 1550 02/21/19 1627  BP: (!) 170/100 (!) 160/100  Pulse: 98   Resp: 16   Temp: (!) 97.3 F (36.3 C)   TempSrc: Temporal   SpO2: 99%   Weight: 127 lb 3.2 oz (57.7 kg)   Height: 4' 10"  (1.473 m)     Body mass index is 26.58 kg/m.  Physical Exam  Constitutional: Patient appears well-developed and well-nourished. Overweight.  No distress.  HEENT: head atraumatic, normocephalic, pupils equal and reactive to light Cardiovascular: Normal rate, regular rhythm and normal heart sounds.  No murmur heard. No BLE edema. Pulmonary/Chest: Effort normal and breath sounds normal. No respiratory distress. Abdominal: Soft.  There is no  tenderness. Psychiatric: Patient has a normal mood and affect. behavior is normal. Judgment and thought content normal.  Recent Results (from the past 2160 hour(s))  IGP,rfx Aptima HPV all pth     Status: Abnormal   Collection Time: 12/02/18  9:45 AM  Result Value Ref Range   Interpretation EPCA,ASUSB (A)     Comment: EPITHELIAL CELL ABNORMALITY. ATYPICAL SQUAMOUS CELLS OF UNDETERMINED SIGNIFICANCE (ASC-US).    Category ASC-US (A)     Comment: Atypical Squamous Cells of Undetermined Significance   Adequacy ENDO     Comment: Satisfactory for evaluation. Endocervical and/or squamous metaplastic cells (  endocervical component) are present.    Clinician Provided ICD10 Comment     Comment: Z12.4   Performed by: Comment     Comment: Wynona Meals, Cytotechnologist (ASCP)   Electronically signed by: Comment     Comment: Porfirio Mylar, MD, Pathologist   PATHOLOGIST PROVIDED ICD10: Comment     Comment: R87.610   Note: Comment     Comment: The Pap smear is a screening test designed to aid in the detection of premalignant and malignant conditions of the uterine cervix.  It is not a diagnostic procedure and should not be used as the sole means of detecting cervical cancer.  Both false-positive and false-negative reports do occur.    Test Methodology Comment     Comment: This liquid based ThinPrep(R) pap test was screened with the use of an image guided system.    PAP Reflex Comment     Comment: See below for HPV testing results.  HPV Aptima     Status: None   Collection Time: 12/02/18  9:45 AM  Result Value Ref Range   HPV Aptima Negative Negative    Comment: This nucleic acid amplification test detects fourteen high-risk HPV types (16,18,31,33,35,39,45,51,52,56,58,59,66,68) without differentiation.   Fecal occult blood, imunochemical     Status: None   Collection Time: 12/06/18 12:00 AM  Result Value Ref Range   Fecal Occult Bld Negative Negative     PHQ2/9: Depression  screen [facility] 2/9 02/21/2019 07/17/2018 02/25/2016 11/04/2015  Decreased Interest 0 0 0 0  Down, Depressed, Hopeless 0 0 0 0  PHQ - 2 Score 0 0 0 0  Altered sleeping 0 1 - -  Tired, decreased energy 0 0 - -  Change in appetite 0 0 - -  Feeling bad or failure about yourself  0 1 - -  Trouble concentrating 0 0 - -  Moving slowly or fidgety/restless 0 0 - -  Suicidal thoughts 0 0 - -  PHQ-9 Score 0 2 - -  Difficult doing work/chores Not difficult at all Somewhat difficult - -    phq 9 is negative  Fall Risk: Fall Risk  02/21/2019 08/21/2018 07/17/2018 02/25/2016 11/04/2015  Falls in the past year? 0 0 0 No No  Number falls in past yr: 0 0 0 - -  Injury with Fall? 0 0 0 - -     Functional Status Survey: Is the patient deaf or have difficulty hearing?: No Does the patient have difficulty seeing, even when wearing glasses/contacts?: No Does the patient have difficulty concentrating, remembering, or making decisions?: No Does the patient have difficulty walking or climbing stairs?: No Does the patient have difficulty dressing or bathing?: No Does the patient have difficulty doing errands alone such as visiting a doctor's office or shopping?: No    Assessment & Plan  1. Uncontrolled hypertension  - Microalbumin / creatinine urine ratio - COMPLETE METABOLIC PANEL WITH GFR - CBC with Differential/Platelet - lisinopril-hydrochlorothiazide (ZESTORETIC) 20-12.5 MG tablet; Take 1 tablet by mouth daily.  Dispense: 30 tablet; Refill: 0  2. Dyslipidemia  - Lipid panel  3. GAD (generalized anxiety disorder)  Doing well   4. Vitamin D deficiency  - VITAMIN D 25 Hydroxy (Vit-D Deficiency, Fractures) - Vitamin D, Ergocalciferol, (DRISDOL) 1.25 MG (50000 UT) CAPS capsule; Take 1 capsule (50,000 Units total) by mouth every 7 (seven) days.  Dispense: 12 capsule; Refill: 0  5. Metabolic syndrome  - Hemoglobin A1c - metFORMIN (GLUCOPHAGE-XR) 500 MG 24 hr tablet; Take 1 tablet (500  mg total) by  mouth daily with breakfast.  Dispense: 90 tablet; Refill: 1

## 2019-02-28 ENCOUNTER — Ambulatory Visit: Payer: Managed Care, Other (non HMO)

## 2019-02-28 ENCOUNTER — Other Ambulatory Visit: Payer: Self-pay

## 2019-02-28 VITALS — BP 118/86 | HR 97

## 2019-02-28 DIAGNOSIS — I10 Essential (primary) hypertension: Secondary | ICD-10-CM

## 2019-02-28 NOTE — Progress Notes (Signed)
Patient is here for a blood pressure check. Patient denies chest pain, palpitations, shortness of breath or visual disturbances. At previous visit blood pressure was 160/100 with a heart rate of 98. Today during nurse visit first check blood pressure was 118/86 and heart rate was 97. She does take any blood pressure medications.  States went she first start taking the Lisinopril-HCTZ 20-12.5 mg she felt nauseous but it has subsided. Only complaint was headaches occasionally.

## 2019-03-21 ENCOUNTER — Other Ambulatory Visit: Payer: Self-pay

## 2019-03-21 ENCOUNTER — Encounter: Payer: Self-pay | Admitting: Family Medicine

## 2019-03-21 ENCOUNTER — Ambulatory Visit: Payer: Managed Care, Other (non HMO) | Admitting: Family Medicine

## 2019-03-21 VITALS — BP 98/72 | HR 98 | Temp 97.3°F | Resp 16 | Ht <= 58 in | Wt 125.5 lb

## 2019-03-21 DIAGNOSIS — Z23 Encounter for immunization: Secondary | ICD-10-CM | POA: Diagnosis not present

## 2019-03-21 DIAGNOSIS — E8881 Metabolic syndrome: Secondary | ICD-10-CM

## 2019-03-21 DIAGNOSIS — I10 Essential (primary) hypertension: Secondary | ICD-10-CM | POA: Diagnosis not present

## 2019-03-21 MED ORDER — LISINOPRIL-HYDROCHLOROTHIAZIDE 10-12.5 MG PO TABS: 1.0000 | ORAL_TABLET | Freq: Every day | ORAL | 0 refills | Status: DC

## 2019-03-21 NOTE — Progress Notes (Signed)
Name: Kathleen Villanueva   MRN: [id number]    DOB: 05-Mar-1971   Date:03/21/2019       Progress Note  Subjective  Chief Complaint  Chief Complaint  Patient presents with  . Follow-up    1 month F/U HTN and Discuss Labs  . Hypertension    States her mouth has been really dry and having a lingering headache and feeling tired    HPI  HTN: she was seen on 02/21/2019 and bp was very high 170/100 followed by 160/100 after rest, she had a history of hypertension but was out of medication, we re-started her on lisinopril hczt 20/12.5 mg and after one week she returned for a nurse visit and bp was down to 118/86, she has decreased caffeine intake, she has ben walking for 30 minutes every morning and going to the gym a couple of times a week, she has noticed a dry mouth and some headaches. Denies dizziness or palpitation. BP today is low, discussed decreasing medication and returning in one week for a recheck we may need to stop hctz and monitor, she has added life style modification and may be the reason bp dropped. She had labs done today and results are not available for review   Metabolic syndrome: she is taking metformin and denies side effects, also exercising and eating healthy   Patient Active Problem List   Diagnosis Date Noted  . Metabolic syndrome 03/00/9233  . Dyslipidemia 08/13/2018  . Microalbuminuria 08/13/2018  . GAD (generalized anxiety disorder) 08/25/2016  . Allergy to other foods 11/25/2015  . H/O renal calculi 11/25/2015  . Vitamin D deficiency 11/25/2015  . Hyperlipidemia with low HDL 11/25/2015  . Overweight (BMI 25.0-29.9) 11/04/2015  . Seasonal allergies 11/04/2015  . Chronic constipation 11/04/2015  . Other bursal cyst, left ankle and foot 11/04/2015    Past Surgical History:  Procedure Laterality Date  . AUGMENTATION MAMMAPLASTY Bilateral 07/03/2004  . BREAST ENHANCEMENT SURGERY Bilateral   . COLPOSCOPY  07/1995  . CYSTOSCOPY  2012  . DIAGNOSTIC LAPAROSCOPY   04/13/2000   for chronic RLQ pain; normal anatomy  . EXTRACORPOREAL SHOCK WAVE LITHOTRIPSY    . 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 TUBAL LIGATION      Family History  Problem Relation Age of Onset  . Cancer Mother        cervical  . Cancer Maternal Uncle        Kidney  . Heart attack Father   . Hypertension Father   . Hypercholesterolemia Father   . Gout Father   . Gout Brother   . Breast cancer Cousin 32    Social History   Socioeconomic History  . Marital status: Married    Spouse name: Not on file  . Number of children: 3  . Years of education: Not on file  . Highest education level: Not on file  Occupational History  . Occupation: Management consultant     Comment: [employer]  . Financial resource strain: Not hard at all  . Food insecurity    Worry: Never true    Inability: Never true  . Transportation needs    Medical: No    Non-medical: No  Tobacco Use  . Smoking status: Never Smoker  . Smokeless tobacco: Never Used  Substance and Sexual Activity  . Alcohol use: No    Alcohol/week: 0.0 standard drinks  . Drug use: No  . Sexual activity: Yes    Partners: Male  Birth control/protection: Surgical  Lifestyle  . Physical activity    Days per week: 0 days    Minutes per session: 0 min  . Stress: Very much  Relationships  . Social connections    Talks on phone: More than three times a week    Gets together: More than three times a week    Attends religious service: Never    Active member of club or organization: No    Attends meetings of clubs or organizations: Never    Relationship status: Married  . Intimate partner violence    Fear of current or ex partner: No    Emotionally abused: No    Physically abused: No    Forced sexual activity: No  Other Topics Concern  . Not on file  Social History Narrative   She works at [employer] for many years, but had a promotion Nov 2018 and states wants to go down again, too stressed       Current Outpatient Medications:  .  Cholecalciferol (VITAMIN D) 50 MCG (2000 UT) CAPS, Take 1 capsule (2,000 Units total) by mouth daily., Disp: 30 capsule, Rfl: 0 .  ibuprofen (ADVIL) 400 MG tablet, , Disp: , Rfl:  .  metFORMIN (GLUCOPHAGE-XR) 500 MG 24 hr tablet, Take 1 tablet (500 mg total) by mouth daily with breakfast., Disp: 90 tablet, Rfl: 1 .  Vitamin D, Ergocalciferol, (DRISDOL) 1.25 MG (50000 UT) CAPS capsule, Take 1 capsule (50,000 Units total) by mouth every 7 (seven) days., Disp: 12 capsule, Rfl: 0 .  chlorhexidine (PERIDEX) 0.12 % solution, PLEASE SEE ATTACHED FOR DETAILED DIRECTIONS, Disp: , Rfl:  .  lisinopril-hydrochlorothiazide (ZESTORETIC) 10-12.5 MG tablet, Take 1 tablet by mouth daily., Disp: 90 tablet, Rfl: 0  No Known Allergies  I personally reviewed active problem list, medication list, allergies, family history, social history, health maintenance with the patient/caregiver today.   ROS  Ten systems reviewed and is negative except as mentioned in HPI   Objective  Vitals:   03/21/19 1549 03/21/19 1608  BP: 98/62 98/72  Pulse: 98   Resp: 16   Temp: (!) 97.3 F (36.3 C)   TempSrc: Temporal   SpO2: 97%   Weight: 125 lb 8 oz (56.9 kg)   Height: 4' 10"  (1.473 m)     Body mass index is 26.23 kg/m.  Physical Exam  Constitutional: Patient appears well-developed and well-nourished. Overweight.  No distress.  HEENT: head atraumatic, normocephalic, pupils equal and reactive to light Cardiovascular: Normal rate, regular rhythm and normal heart sounds.  No murmur heard. No BLE edema. Pulmonary/Chest: Effort normal and breath sounds normal. No respiratory distress. Abdominal: Soft.  There is no tenderness. Psychiatric: Patient has a normal mood and affect. behavior is normal. Judgment and thought content normal.  PHQ2/9: Depression screen [facility] 2/9 02/21/2019 07/17/2018 02/25/2016 11/04/2015  Decreased Interest 0 0 0 0  Down, Depressed, Hopeless 0 0 0 0  PHQ  - 2 Score 0 0 0 0  Altered sleeping 0 1 - -  Tired, decreased energy 0 0 - -  Change in appetite 0 0 - -  Feeling bad or failure about yourself  0 1 - -  Trouble concentrating 0 0 - -  Moving slowly or fidgety/restless 0 0 - -  Suicidal thoughts 0 0 - -  PHQ-9 Score 0 2 - -  Difficult doing work/chores Not difficult at all Somewhat difficult - -    phq 9 is negative   Fall Risk: Fall Risk  03/21/2019 02/21/2019 08/21/2018 07/17/2018 02/25/2016  Falls in the past year? 0 0 0 0 No  Number falls in past yr: 0 0 0 0 -  Injury with Fall? 0 0 0 0 -     Functional Status Survey: Is the patient deaf or have difficulty hearing?: No Does the patient have difficulty seeing, even when wearing glasses/contacts?: No Does the patient have difficulty concentrating, remembering, or making decisions?: No Does the patient have difficulty walking or climbing stairs?: No Does the patient have difficulty dressing or bathing?: No Does the patient have difficulty doing errands alone such as visiting a doctor's office or shopping?: No   Assessment & Plan  1. Essential hypertension  We will decrease dose since bp is low now, she has changed her diet, exercising  - lisinopril-hydrochlorothiazide (ZESTORETIC) 10-12.5 MG tablet; Take 1 tablet by mouth daily.  Dispense: 90 tablet; Refill: 0  2. Needs flu shot  - Flu Vaccine QUAD 6+ mos PF IM (Fluarix Quad PF)  3. Metabolic syndrome  She is taking medication and denies side effects

## 2019-03-22 LAB — CMP14+EGFR
ALT: 28 IU/L (ref 0–32)
AST: 23 IU/L (ref 0–40)
Albumin/Globulin Ratio: 1.4 (ref 1.2–2.2)
Albumin: 4.5 g/dL (ref 3.8–4.8)
Alkaline Phosphatase: 32 IU/L — ABNORMAL LOW (ref 39–117)
BUN/Creatinine Ratio: 15 (ref 9–23)
BUN: 14 mg/dL (ref 6–24)
Bilirubin Total: 0.7 mg/dL (ref 0.0–1.2)
CO2: 24 mmol/L (ref 20–29)
Calcium: 9.4 mg/dL (ref 8.7–10.2)
Chloride: 98 mmol/L (ref 96–106)
Creatinine, Ser: 0.92 mg/dL (ref 0.57–1.00)
GFR calc Af Amer: 86 mL/min/{1.73_m2} (ref >59)
GFR calc non Af Amer: 74 mL/min/{1.73_m2} (ref >59)
Globulin, Total: 3.3 g/dL (ref 1.5–4.5)
Glucose: 133 mg/dL — ABNORMAL HIGH (ref 65–99)
Potassium: 4.2 mmol/L (ref 3.5–5.2)
Sodium: 136 mmol/L (ref 134–144)
Total Protein: 7.8 g/dL (ref 6.0–8.5)

## 2019-03-22 LAB — MICROALBUMIN / CREATININE URINE RATIO
Creatinine, Urine: 142.2 mg/dL
Microalb/Creat Ratio: 129 mg/g creat — ABNORMAL HIGH (ref 0–29)
Microalbumin, Urine: 183.3 ug/mL

## 2019-03-22 LAB — LIPID PANEL
Chol/HDL Ratio: 6.6 ratio — ABNORMAL HIGH (ref 0.0–4.4)
Cholesterol, Total: 219 mg/dL — ABNORMAL HIGH (ref 100–199)
HDL: 33 mg/dL — ABNORMAL LOW (ref >39)
LDL Chol Calc (NIH): 123 mg/dL — ABNORMAL HIGH (ref 0–99)
Triglycerides: 353 mg/dL — ABNORMAL HIGH (ref 0–149)
VLDL Cholesterol Cal: 63 mg/dL — ABNORMAL HIGH (ref 5–40)

## 2019-03-22 LAB — HEMOGLOBIN A1C
Est. average glucose Bld gHb Est-mCnc: 134 mg/dL
Hgb A1c MFr Bld: 6.3 % — ABNORMAL HIGH (ref 4.8–5.6)

## 2019-03-22 LAB — CBC WITH DIFFERENTIAL/PLATELET
Basophils Absolute: 0.1 10*3/uL (ref 0.0–0.2)
Basos: 1 %
EOS (ABSOLUTE): 0.5 10*3/uL — ABNORMAL HIGH (ref 0.0–0.4)
Eos: 7 %
Hematocrit: 43.5 % (ref 34.0–46.6)
Hemoglobin: 14.8 g/dL (ref 11.1–15.9)
Immature Grans (Abs): 0 10*3/uL (ref 0.0–0.1)
Immature Granulocytes: 0 %
Lymphocytes Absolute: 3.1 10*3/uL (ref 0.7–3.1)
Lymphs: 44 %
MCH: 29 pg (ref 26.6–33.0)
MCHC: 34 g/dL (ref 31.5–35.7)
MCV: 85 fL (ref 79–97)
Monocytes Absolute: 0.6 10*3/uL (ref 0.1–0.9)
Monocytes: 8 %
Neutrophils Absolute: 2.8 10*3/uL (ref 1.4–7.0)
Neutrophils: 40 %
Platelets: 256 10*3/uL (ref 150–450)
RBC: 5.11 x10E6/uL (ref 3.77–5.28)
RDW: 13.5 % (ref 11.7–15.4)
WBC: 7 10*3/uL (ref 3.4–10.8)

## 2019-03-22 LAB — VITAMIN D 25 HYDROXY (VIT D DEFICIENCY, FRACTURES): Vit D, 25-Hydroxy: 25.6 ng/mL — ABNORMAL LOW (ref 30.0–100.0)

## 2019-03-28 ENCOUNTER — Other Ambulatory Visit: Payer: Self-pay

## 2019-03-28 ENCOUNTER — Ambulatory Visit (INDEPENDENT_AMBULATORY_CARE_PROVIDER_SITE_OTHER): Payer: Managed Care, Other (non HMO)

## 2019-03-28 ENCOUNTER — Other Ambulatory Visit: Payer: Self-pay | Admitting: Family Medicine

## 2019-03-28 VITALS — BP 132/98 | HR 73

## 2019-03-28 DIAGNOSIS — Z0130 Encounter for examination of blood pressure without abnormal findings: Secondary | ICD-10-CM

## 2019-03-28 DIAGNOSIS — I10 Essential (primary) hypertension: Secondary | ICD-10-CM

## 2019-03-28 MED ORDER — HYDROCHLOROTHIAZIDE 12.5 MG PO TABS: 12.5000 mg | ORAL_TABLET | Freq: Every day | ORAL | 0 refills | Status: DC

## 2019-03-28 NOTE — Addendum Note (Signed)
Addended by: Chilton Greathouse on: 03/28/2019 02:13 PM   Modules accepted: Orders

## 2019-03-28 NOTE — Addendum Note (Signed)
Addended by: Steele Sizer F on: 03/28/2019 02:17 PM   Modules accepted: Orders

## 2019-03-28 NOTE — Telephone Encounter (Signed)
Requested medication (s) are due for refill today: yes  Requested medication (s) are on the active medication list: yes  Last refill:  03/28/2019  Future visit scheduled: yes  Notes to clinic:  Patient requesting 90 day    Requested Prescriptions  Pending Prescriptions Disp Refills   hydrochlorothiazide (HYDRODIURIL) 12.5 MG tablet [Pharmacy Med Name: HYDROCHLOROTHIAZIDE 12.5MG TABLETS] 90 tablet     Sig: TAKE 1 TABLET(12.5 MG) BY MOUTH DAILY     Cardiovascular: Diuretics - Thiazide Failed - 03/28/2019  2:25 PM      Failed - Last BP in normal range    BP Readings from Last 1 Encounters:  03/28/19 (!) 132/98         Passed - Ca in normal range and within 360 days    Calcium  Date Value Ref Range Status  03/21/2019 9.4 8.7 - 10.2 mg/dL Final         Passed - Cr in normal range and within 360 days    Creatinine, Ser  Date Value Ref Range Status  03/21/2019 0.92 0.57 - 1.00 mg/dL Final         Passed - K in normal range and within 360 days    Potassium  Date Value Ref Range Status  03/21/2019 4.2 3.5 - 5.2 mmol/L Final         Passed - Na in normal range and within 360 days    Sodium  Date Value Ref Range Status  03/21/2019 136 134 - 144 mmol/L Final         Passed - Valid encounter within last 6 months    Recent Outpatient Visits          1 week ago Essential hypertension   [facility] Steele Sizer, MD   1 month ago Uncontrolled hypertension   [facility] Steele Sizer, MD   7 months ago Uncontrolled hypertension   [facility] Steele Sizer, MD   8 months ago Atypical chest pain   [facility] Steele Sizer, MD   2 years ago Dyslipidemia   [facility] Steele Sizer, MD      Future Appointments            In 2 months Ancil Boozer, Drue Stager, MD [facility]

## 2019-03-28 NOTE — Progress Notes (Addendum)
Patient is here for a blood pressure check. Patient denies chest pain, palpitations, shortness of breath or visual disturbances. At previous visit blood pressure was 98/72 with a heart rate of 98. Today during nurse visit first check blood pressure was 150/100. After resting for 10 minutes it was 136/100 and heart rate was 73. She does take blood pressure medications as prescribed. She missed one dose this week. She has a dry cough and feels a little nauseous, sleepy and burning and tingling in her hands when taking Lisinopril. Blood pressure was taken again and it was 132/98.   Pcp has changed her medication to HCTZ 12.5 mg once daily

## 2019-06-16 ENCOUNTER — Other Ambulatory Visit: Payer: Self-pay | Admitting: Family Medicine

## 2019-06-16 DIAGNOSIS — Z1231 Encounter for screening mammogram for malignant neoplasm of breast: Secondary | ICD-10-CM

## 2019-06-23 ENCOUNTER — Encounter: Payer: Self-pay | Admitting: Family Medicine

## 2019-06-23 ENCOUNTER — Ambulatory Visit (INDEPENDENT_AMBULATORY_CARE_PROVIDER_SITE_OTHER): Payer: Managed Care, Other (non HMO) | Admitting: Family Medicine

## 2019-06-23 ENCOUNTER — Other Ambulatory Visit: Payer: Self-pay

## 2019-06-23 VITALS — BP 130/92 | HR 103 | Temp 97.3°F | Resp 16 | Ht <= 58 in | Wt 125.0 lb

## 2019-06-23 DIAGNOSIS — I10 Essential (primary) hypertension: Secondary | ICD-10-CM

## 2019-06-23 DIAGNOSIS — G44229 Chronic tension-type headache, not intractable: Secondary | ICD-10-CM | POA: Diagnosis not present

## 2019-06-23 DIAGNOSIS — R809 Proteinuria, unspecified: Secondary | ICD-10-CM

## 2019-06-23 DIAGNOSIS — E559 Vitamin D deficiency, unspecified: Secondary | ICD-10-CM

## 2019-06-23 MED ORDER — VITAMIN D (ERGOCALCIFEROL) 1.25 MG (50000 UNIT) PO CAPS: 50000.0000 [IU] | ORAL_CAPSULE | ORAL | 0 refills | Status: DC

## 2019-06-23 NOTE — Progress Notes (Signed)
Name: Kathleen Villanueva   MRN: [id number]    DOB: [birth year]   Date:06/24/2019       Progress Note  Subjective  Chief Complaint  Chief Complaint  Patient presents with  . Follow-up    3 month follow up    HPI  HTN: she was seen on 02/21/2019 and bp was very high 170/100 followed by 160/100 after rest, she had a history of hypertension but was out of medication, we re-started her on lisinopril hczt 20/12.5 mg and after one week she returned for a nurse visit and bp was down to 118/86, she had decreased caffeine intake, she was walking for 30 minutes every morning and going to the gym a couple of times a week, she had noticed a dry mouth and some headaches BP today was low and we decided to stop lisinopril ( she did not like it) and gave her hydrochlorothiazide 12.5 mg, she states at home bp has been at goal 130's, however today when she arrived it was elevated again, she also has microalbuminuria. We will try losartan hydrochlorothiazide. Also advised to take advil daily for mild frontal headache ( likely tension or rebound ), stay hydrated and return for cma visit bp check in one week and with me in one month  Metabolic syndrome: she stopped taking Metformin when she joined boot camp a couple of months ago, and is eating healthier, however currently on a hiatus from boot camp. She lost weight.   Patient Active Problem List   Diagnosis Date Noted  . Metabolic syndrome 70/62/3762  . Dyslipidemia 08/13/2018  . Microalbuminuria 08/13/2018  . GAD (generalized anxiety disorder) 08/25/2016  . Allergy to other foods 11/25/2015  . H/O renal calculi 11/25/2015  . Vitamin D deficiency 11/25/2015  . Hyperlipidemia with low HDL 11/25/2015  . Overweight (BMI 25.0-29.9) 11/04/2015  . Seasonal allergies 11/04/2015  . Chronic constipation 11/04/2015  . Other bursal cyst, left ankle and foot 11/04/2015    Past Surgical History:  Procedure Laterality Date  . AUGMENTATION MAMMAPLASTY Bilateral  07/03/2004  . BREAST ENHANCEMENT SURGERY Bilateral   . COLPOSCOPY  07/1995  . CYSTOSCOPY  2012  . DIAGNOSTIC LAPAROSCOPY  04/13/2000   for chronic RLQ pain; normal anatomy  . EXTRACORPOREAL SHOCK WAVE LITHOTRIPSY    . 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 TUBAL LIGATION      Family History  Problem Relation Age of Onset  . Cancer Mother        cervical  . Cancer Maternal Uncle        Kidney  . Heart attack Father   . Hypertension Father   . Hypercholesterolemia Father   . Gout Father   . Gout Brother   . Breast cancer Cousin 75    Social History   Socioeconomic History  . Marital status: Married    Spouse name: Not on file  . Number of children: 3  . Years of education: Not on file  . Highest education level: Not on file  Occupational History  . Occupation: Management consultant     Comment: [employer]   Tobacco Use  . Smoking status: Never Smoker  . Smokeless tobacco: Never Used  Substance and Sexual Activity  . Alcohol use: No    Alcohol/week: 0.0 standard drinks  . Drug use: No  . Sexual activity: Yes    Partners: Male    Birth control/protection: Surgical  Other Topics Concern  . Not on file  Social History Narrative  She works at [employer] for many years, but had a promotion Nov 2018 and states wants to go down again, too stressed    She is budist   Social Determinants of Radio broadcast assistant Strain: Low Risk   . Difficulty of Paying Living Expenses: Not hard at all  Food Insecurity: No Food Insecurity  . Worried About Charity fundraiser in the Last Year: Never true  . Ran Out of Food in the Last Year: Never true  Transportation Needs: No Transportation Needs  . Lack of Transportation (Medical): No  . Lack of Transportation (Non-Medical): No  Physical Activity: Insufficiently Active  . Days of Exercise per Week: 2 days  . Minutes of Exercise per Session: 30 min  Stress: No Stress Concern Present  . Feeling of Stress : Not at all   Social Connections: Slightly Isolated  . Frequency of Communication with Friends and Family: More than three times a week  . Frequency of Social Gatherings with Friends and Family: More than three times a week  . Attends Religious Services: More than 4 times per year  . Active Member of Clubs or Organizations: No  . Attends Archivist Meetings: Never  . Marital Status: Married  Human resources officer Violence: Not At Risk  . Fear of Current or Ex-Partner: No  . Emotionally Abused: No  . Physically Abused: No  . Sexually Abused: No     Current Outpatient Medications:  .  Cholecalciferol (VITAMIN D) 50 MCG (2000 UT) CAPS, Take 1 capsule (2,000 Units total) by mouth daily., Disp: 30 capsule, Rfl: 0 .  acetaminophen (TYLENOL) 500 MG tablet, Take 1 tablet (500 mg total) by mouth every 6 (six) hours as needed., Disp: 30 tablet, Rfl: 0 .  chlorhexidine (PERIDEX) 0.12 % solution, PLEASE SEE ATTACHED FOR DETAILED DIRECTIONS, Disp: , Rfl:  .  losartan-hydrochlorothiazide (HYZAAR) 50-12.5 MG tablet, Take 0.5-1 tablets by mouth daily., Disp: 90 tablet, Rfl: 0 .  metFORMIN (GLUCOPHAGE-XR) 500 MG 24 hr tablet, Take 1 tablet (500 mg total) by mouth daily with breakfast. (Patient not taking: Reported on 06/23/2019), Disp: 90 tablet, Rfl: 1 .  Vitamin D, Ergocalciferol, (DRISDOL) 1.25 MG (50000 UT) CAPS capsule, Take 1 capsule (50,000 Units total) by mouth every 7 (seven) days., Disp: 12 capsule, Rfl: 0  No Known Allergies  I personally reviewed active problem list, medication list, allergies, family history, social history, health maintenance with the patient/caregiver today.   ROS  Constitutional: Negative for fever or weight change.  Respiratory: Negative for cough and shortness of breath.   Cardiovascular: Negative for chest pain or palpitations.  Gastrointestinal: Negative for abdominal pain, no bowel changes.  Musculoskeletal: Negative for gait problem or joint swelling.  Skin: Negative  for rash.  Neurological: Negative for dizziness or headache.  No other specific complaints in a complete review of systems (except as listed in HPI above).  Objective  Vitals:   06/23/19 1500 06/23/19 1542  BP: (!) 160/100 (!) 130/92  Pulse: (!) 103   Resp: 16   Temp: (!) 97.3 F (36.3 C)   TempSrc: Temporal   SpO2: 98%   Weight: 125 lb (56.7 kg)   Height: 4' 10"  (1.473 m)     Body mass index is 26.13 kg/m.  Physical Exam  Constitutional: Patient appears well-developed and well-nourished.  No distress.  HEENT: head atraumatic, normocephalic, pupils equal and reactive to light Cardiovascular: Normal rate, regular rhythm and normal heart sounds.  No murmur heard. No BLE  edema. Pulmonary/Chest: Effort normal and breath sounds normal. No respiratory distress. Abdominal: Soft.  There is no tenderness. Psychiatric: Patient has a normal mood and affect. behavior is normal. Judgment and thought content normal.  PHQ2/9: Depression screen [facility] 2/9 06/23/2019 02/21/2019 07/17/2018 02/25/2016 11/04/2015  Decreased Interest 0 0 0 0 0  Down, Depressed, Hopeless 0 0 0 0 0  PHQ - 2 Score 0 0 0 0 0  Altered sleeping 0 0 1 - -  Tired, decreased energy 1 0 0 - -  Change in appetite 0 0 0 - -  Feeling bad or failure about yourself  0 0 1 - -  Trouble concentrating 0 0 0 - -  Moving slowly or fidgety/restless 0 0 0 - -  Suicidal thoughts 0 0 0 - -  PHQ-9 Score 1 0 2 - -  Difficult doing work/chores Not difficult at all Not difficult at all Somewhat difficult - -    phq 9 is negative   Fall Risk: Fall Risk  06/23/2019 03/21/2019 02/21/2019 08/21/2018 07/17/2018  Falls in the past year? 0 0 0 0 0  Number falls in past yr: 0 0 0 0 0  Injury with Fall? 0 0 0 0 0    Functional Status Survey: Is the patient deaf or have difficulty hearing?: No Does the patient have difficulty seeing, even when wearing glasses/contacts?: No Does the patient have difficulty concentrating, remembering, or making  decisions?: No Does the patient have difficulty walking or climbing stairs?: No Does the patient have difficulty dressing or bathing?: No Does the patient have difficulty doing errands alone such as visiting a doctor's office or shopping?: No    Assessment & Plan   1. Essential hypertension  - losartan-hydrochlorothiazide (HYZAAR) 50-12.5 MG tablet; Take 0.5-1 tablets by mouth daily.  Dispense: 90 tablet; Refill: 0  2. Microalbuminuria  Needs ARB  3. Vitamin D deficiency  - Vitamin D, Ergocalciferol, (DRISDOL) 1.25 MG (50000 UT) CAPS capsule; Take 1 capsule (50,000 Units total) by mouth every 7 (seven) days.  Dispense: 12 capsule; Refill: 0  4. Chronic tension-type headache, not intractable  Discussed rebound headaches, needs to stop all pain medications, but can start by stopping nsaid's since bp is elevated - acetaminophen (TYLENOL) 500 MG tablet; Take 1 tablet (500 mg total) by mouth every 6 (six) hours as needed.  Dispense: 30 tablet; Refill: 0

## 2019-06-24 MED ORDER — ACETAMINOPHEN 500 MG PO TABS: 500.0000 mg | ORAL_TABLET | Freq: Four times a day (QID) | ORAL | 0 refills | Status: DC | PRN

## 2019-06-24 MED ORDER — LOSARTAN POTASSIUM-HCTZ 50-12.5 MG PO TABS: 0.5000 | ORAL_TABLET | Freq: Every day | ORAL | 0 refills | Status: DC

## 2019-06-30 ENCOUNTER — Other Ambulatory Visit: Payer: Self-pay

## 2019-06-30 ENCOUNTER — Ambulatory Visit (INDEPENDENT_AMBULATORY_CARE_PROVIDER_SITE_OTHER): Payer: Managed Care, Other (non HMO)

## 2019-06-30 VITALS — BP 136/100 | HR 86

## 2019-06-30 DIAGNOSIS — Z0130 Encounter for examination of blood pressure without abnormal findings: Secondary | ICD-10-CM

## 2019-06-30 NOTE — Progress Notes (Signed)
Patient is here for a blood pressure check. Patient denies chest pain, palpitations, shortness of breath or visual disturbances. At previous visit blood pressure was 130/92 with a heart rate of 103. Today during nurse visit first check blood pressure was 120/100 with heart rate of 86. She does take blood pressure medications as prescribed with no missed doses.  She reports having a bad migraine on the second day after taking medication. She skipped a day and started taking a half tablet on Saturday.

## 2019-07-03 ENCOUNTER — Telehealth: Payer: Self-pay | Admitting: Family Medicine

## 2019-07-03 NOTE — Telephone Encounter (Signed)
Pt would like to know if Dr Ancil Boozer will prescribe again for her the escitalopram (LEXAPRO) 5 MG tablet   Pt states she needs something to calm her nerves right now.  She has a family member that is sick, and she has been anxious lately. Over thinking things. Looking for something to "calm her down".TODAY .  Something that will work right away   [place], [address] Phone:  [phone]  Fax:  [phone]

## 2019-07-07 ENCOUNTER — Other Ambulatory Visit: Payer: Self-pay | Admitting: Family Medicine

## 2019-07-07 MED ORDER — ESCITALOPRAM OXALATE 5 MG PO TABS: 5.0000 mg | ORAL_TABLET | Freq: Every day | ORAL | 0 refills | Status: DC

## 2019-07-25 ENCOUNTER — Ambulatory Visit
Admission: RE | Admit: 2019-07-25 | Discharge: 2019-07-25 | Disposition: A | Discharge status: Home / Self Care | Payer: Managed Care, Other (non HMO) | Source: Ambulatory Visit | Attending: Family Medicine | Admitting: Family Medicine

## 2019-07-25 ENCOUNTER — Ambulatory Visit: Payer: Managed Care, Other (non HMO) | Admitting: Family Medicine

## 2019-07-25 DIAGNOSIS — Z1231 Encounter for screening mammogram for malignant neoplasm of breast: Secondary | ICD-10-CM

## 2019-08-12 NOTE — Progress Notes (Signed)
Steele Sizer, MD   Chief Complaint  Patient presents with  . Amenorrhea    missed januarys period and feb, has been under a lot of stress  . [employer] Employee    HPI:      [name] is a 49 y.o. [phone] who LMP was Patient's last menstrual period was 06/10/2019 (approximate)., presents today due to no menses for the past 2 months. Menses are usually monthly, last 7 days, no BTB, mild dysmen. She has skipped Jan and Feb periods. Having occas cramping like a period but no bleeding. No vasomotor sx. She was under increased stress 1/21 due to her dad having covid. He is better now but she was having severe anxiety and panic attacks. She is sex active, s/p BTL.  Normal TSH 2/20.  Annual due 6/21.   Patient Active Problem List   Diagnosis Date Noted  . Metabolic syndrome 23/53/6144  . Dyslipidemia 08/13/2018  . Microalbuminuria 08/13/2018  . GAD (generalized anxiety disorder) 08/25/2016  . Allergy to other foods 11/25/2015  . H/O renal calculi 11/25/2015  . Vitamin D deficiency 11/25/2015  . Hyperlipidemia with low HDL 11/25/2015  . Overweight (BMI 25.0-29.9) 11/04/2015  . Seasonal allergies 11/04/2015  . Chronic constipation 11/04/2015  . Other bursal cyst, left ankle and foot 11/04/2015    Past Surgical History:  Procedure Laterality Date  . AUGMENTATION MAMMAPLASTY Bilateral 07/03/2004  . BREAST ENHANCEMENT SURGERY Bilateral   . COLPOSCOPY  07/1995  . CYSTOSCOPY  2012  . DIAGNOSTIC LAPAROSCOPY  04/13/2000   for chronic RLQ pain; normal anatomy  . EXTRACORPOREAL SHOCK WAVE LITHOTRIPSY    . 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 TUBAL LIGATION      Family History  Problem Relation Age of Onset  . Cancer Mother        cervical  . Cancer Maternal Uncle        Kidney  . Heart attack Father   . Hypertension Father   . Hypercholesterolemia Father   . Gout Father   . Gout Brother   . Breast cancer Cousin 61    Social History   Socioeconomic  History  . Marital status: Married    Spouse name: Not on file  . Number of children: 3  . Years of education: Not on file  . Highest education level: Not on file  Occupational History  . Occupation: Management consultant     Comment: [employer]   Tobacco Use  . Smoking status: Never Smoker  . Smokeless tobacco: Never Used  Substance and Sexual Activity  . Alcohol use: No    Alcohol/week: 0.0 standard drinks  . Drug use: No  . Sexual activity: Yes    Partners: Male    Birth control/protection: Surgical    Comment: Tubal ligation  Other Topics Concern  . Not on file  Social History Narrative   She works at [employer] for many years, but had a promotion Nov 2018 and states wants to go down again, too stressed    She is budist   Social Determinants of Radio broadcast assistant Strain:   . Difficulty of Paying Living Expenses: Not on file  Food Insecurity:   . Worried About Charity fundraiser in the Last Year: Not on file  . Ran Out of Food in the Last Year: Not on file  Transportation Needs: No Transportation Needs  . Lack of Transportation (Medical): No  . Lack of Transportation (Non-Medical): No  Physical Activity: Insufficiently Active  . Days of Exercise per Week: 2 days  . Minutes of Exercise per Session: 30 min  Stress: No Stress Concern Present  . Feeling of Stress : Not at all  Social Connections: Slightly Isolated  . Frequency of Communication with Friends and Family: More than three times a week  . Frequency of Social Gatherings with Friends and Family: More than three times a week  . Attends Religious Services: More than 4 times per year  . Active Member of Clubs or Organizations: No  . Attends Archivist Meetings: Never  . Marital Status: Married  Human resources officer Violence: Not At Risk  . Fear of Current or Ex-Partner: No  . Emotionally Abused: No  . Physically Abused: No  . Sexually Abused: No    Outpatient Medications Prior to Visit   Medication Sig Dispense Refill  . losartan-hydrochlorothiazide (HYZAAR) 50-12.5 MG tablet Take 0.5-1 tablets by mouth daily. 90 tablet 0  . Vitamin D, Ergocalciferol, (DRISDOL) 1.25 MG (50000 UT) CAPS capsule Take 1 capsule (50,000 Units total) by mouth every 7 (seven) days. 12 capsule 0  . acetaminophen (TYLENOL) 500 MG tablet Take 1 tablet (500 mg total) by mouth every 6 (six) hours as needed. 30 tablet 0  . chlorhexidine (PERIDEX) 0.12 % solution PLEASE SEE ATTACHED FOR DETAILED DIRECTIONS    . Cholecalciferol (VITAMIN D) 50 MCG (2000 UT) CAPS Take 1 capsule (2,000 Units total) by mouth daily. 30 capsule 0  . escitalopram (LEXAPRO) 5 MG tablet Take 1 tablet (5 mg total) by mouth daily. 30 tablet 0  . metFORMIN (GLUCOPHAGE-XR) 500 MG 24 hr tablet Take 1 tablet (500 mg total) by mouth daily with breakfast. (Patient not taking: Reported on 06/23/2019) 90 tablet 1   No facility-administered medications prior to visit.      ROS:  Review of Systems  Constitutional: Negative for fever.  Gastrointestinal: Negative for blood in stool, constipation, diarrhea, nausea and vomiting.  Genitourinary: Positive for menstrual problem. Negative for dyspareunia, dysuria, flank pain, frequency, hematuria, urgency, vaginal bleeding, vaginal discharge and vaginal pain.  Musculoskeletal: Negative for back pain.  Skin: Negative for rash.  BREAST: No symptoms   OBJECTIVE:   Vitals:  BP 130/90   Ht 4' 10"  (1.473 m)   Wt 124 lb (56.2 kg)   LMP 06/10/2019 (Approximate)   BMI 25.92 kg/m   Physical Exam Vitals reviewed.  Constitutional:      Appearance: She is well-developed.  Pulmonary:     Effort: Pulmonary effort is normal.  Musculoskeletal:        General: Normal range of motion.     Cervical back: Normal range of motion.  Skin:    General: Skin is warm and dry.  Neurological:     General: No focal deficit present.     Mental Status: She is alert and oriented to person, place, and time.      Cranial Nerves: No cranial nerve deficit.  Psychiatric:        Mood and Affect: Mood normal.        Behavior: Behavior normal.        Thought Content: Thought content normal.        Judgment: Judgment normal.     Assessment/Plan: Irregular menses - Plan: TSH + free T4; No menses for 2 months. Check thyroid. If neg, most likely due to stress vs perimenopause. F/u prn.     Return if symptoms worsen or fail to improve.  Alicia B.  Copland, PA-C 08/13/2019 9:30 AM

## 2019-08-13 ENCOUNTER — Other Ambulatory Visit: Payer: Self-pay

## 2019-08-13 ENCOUNTER — Ambulatory Visit (INDEPENDENT_AMBULATORY_CARE_PROVIDER_SITE_OTHER): Payer: Managed Care, Other (non HMO) | Admitting: Obstetrics and Gynecology

## 2019-08-13 ENCOUNTER — Encounter: Payer: Self-pay | Admitting: Obstetrics and Gynecology

## 2019-08-13 VITALS — BP 130/90 | Ht <= 58 in | Wt 124.0 lb

## 2019-08-13 DIAGNOSIS — N925 Other specified irregular menstruation: Secondary | ICD-10-CM | POA: Diagnosis not present

## 2019-08-13 DIAGNOSIS — N926 Irregular menstruation, unspecified: Secondary | ICD-10-CM

## 2019-08-13 NOTE — Patient Instructions (Signed)
I value your feedback and entrusting [place] with your care. If you get a [facility] patient survey, I would appreciate you taking the time to let us know about your experience today. Thank you!  As of June 12, 2019, your lab results will be released to your MyChart immediately, before I even have a chance to see them. Please give me time to review them and contact you if there are any abnormalities. Thank you for your patience.

## 2019-08-14 LAB — TSH+FREE T4
Free T4: 1.22 ng/dL (ref 0.82–1.77)
TSH: 2.35 u[IU]/mL (ref 0.450–4.500)

## 2019-08-15 ENCOUNTER — Encounter: Payer: Self-pay | Admitting: Family Medicine

## 2019-08-15 ENCOUNTER — Ambulatory Visit: Payer: Managed Care, Other (non HMO) | Admitting: Family Medicine

## 2019-08-15 ENCOUNTER — Ambulatory Visit (INDEPENDENT_AMBULATORY_CARE_PROVIDER_SITE_OTHER): Payer: Managed Care, Other (non HMO) | Admitting: Family Medicine

## 2019-08-15 ENCOUNTER — Other Ambulatory Visit: Payer: Self-pay

## 2019-08-15 VITALS — BP 125/92 | HR 74 | Ht <= 58 in | Wt 124.0 lb

## 2019-08-15 DIAGNOSIS — I10 Essential (primary) hypertension: Secondary | ICD-10-CM | POA: Diagnosis not present

## 2019-08-15 DIAGNOSIS — R809 Proteinuria, unspecified: Secondary | ICD-10-CM | POA: Diagnosis not present

## 2019-08-15 DIAGNOSIS — F411 Generalized anxiety disorder: Secondary | ICD-10-CM

## 2019-08-15 DIAGNOSIS — E559 Vitamin D deficiency, unspecified: Secondary | ICD-10-CM | POA: Diagnosis not present

## 2019-08-15 DIAGNOSIS — G44229 Chronic tension-type headache, not intractable: Secondary | ICD-10-CM

## 2019-08-15 DIAGNOSIS — E8881 Metabolic syndrome: Secondary | ICD-10-CM

## 2019-08-15 DIAGNOSIS — E785 Hyperlipidemia, unspecified: Secondary | ICD-10-CM

## 2019-08-15 NOTE — Progress Notes (Signed)
Name: Kathleen Villanueva   MRN: [id number]    DOB: October 09, 1970   Date:08/15/2019       Progress Note  Subjective  Chief Complaint  Chief Complaint  Patient presents with  . Follow-up    1 month F/U  . Hypertension    Has lingering headaches occasionally but has improved with BP medication  . Headache  . Stress    See GYN and missed her cycle in January-checked her Thyroid panel    I connected with  Walida Esty  on 08/15/19 at  3:40 PM EST by a video enabled telemedicine application and verified that I am speaking with the correct person using two identifiers.  I discussed the limitations of evaluation and management by telemedicine and the availability of in person appointments. The patient expressed understanding and agreed to proceed. Staff also discussed with the patient that there may be a patient responsible charge related to this service. Patient Location: at home  Provider Location: [facility]   HPI  HTN: her DBP is still elevated , it was 90 at GYN yesterday, DBP is always 90-92, occasionally below 90 SBP usually below 130's. She snores at night, she wakes up feeling rested when she sleep enough , no morning headaches. Discussed sleep study but we will hold until she comes in for a sleep study . BP is usually better when she first gets up in the mornings. She states headache has improved, happens about once a week and very mild now. She also stopped caffeine. She is now drinking more water  Pre-diabetes: she is no longer drinking sweet beverages, she is also exercising more 2-3 times a week for one hour. She is cutting down on carbohydrates. Eating fish , more vegetables and lot os spinach. She denies polyphagia, polydipsia or polyuria   Dyslipidemia: she is eating more fish and vegetables   Anxiety: she states her father was sick with COVID-19 in January she asked for Lexapro, but she never started , she is using CBD oils prn and is doing well. She states does  not have THC   Patient Active Problem List   Diagnosis Date Noted  . Metabolic syndrome 27/74/1287  . Dyslipidemia 08/13/2018  . Microalbuminuria 08/13/2018  . GAD (generalized anxiety disorder) 08/25/2016  . Allergy to other foods 11/25/2015  . H/O renal calculi 11/25/2015  . Vitamin D deficiency 11/25/2015  . Hyperlipidemia with low HDL 11/25/2015  . Overweight (BMI 25.0-29.9) 11/04/2015  . Seasonal allergies 11/04/2015  . Chronic constipation 11/04/2015  . Other bursal cyst, left ankle and foot 11/04/2015    Past Surgical History:  Procedure Laterality Date  . AUGMENTATION MAMMAPLASTY Bilateral 07/03/2004  . BREAST ENHANCEMENT SURGERY Bilateral   . COLPOSCOPY  07/1995  . CYSTOSCOPY  2012  . DIAGNOSTIC LAPAROSCOPY  04/13/2000   for chronic RLQ pain; normal anatomy  . EXTRACORPOREAL SHOCK WAVE LITHOTRIPSY    . 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 TUBAL LIGATION      Family History  Problem Relation Age of Onset  . Cancer Mother        cervical  . Cancer Maternal Uncle        Kidney  . Heart attack Father   . Hypertension Father   . Hypercholesterolemia Father   . Gout Father   . Gout Brother   . Breast cancer Cousin 66   Social History   Substance and Sexual Activity  Alcohol Use No  . Alcohol/week: 0.0 standard drinks  Social History   Tobacco Use  Smoking Status Never Smoker  Smokeless Tobacco Never Used    Current Outpatient Medications:  .  losartan-hydrochlorothiazide (HYZAAR) 50-12.5 MG tablet, Take 0.5-1 tablets by mouth daily., Disp: 90 tablet, Rfl: 0 .  Multiple Vitamin (MULTIVITAMIN) tablet, Take 1 tablet by mouth daily., Disp: , Rfl:  .  Vitamin D, Ergocalciferol, (DRISDOL) 1.25 MG (50000 UT) CAPS capsule, Take 1 capsule (50,000 Units total) by mouth every 7 (seven) days., Disp: 12 capsule, Rfl: 0  No Known Allergies  I personally reviewed active problem list, medication list, allergies, family history, social history with the  patient/caregiver today.   ROS  Ten systems reviewed and is negative except as mentioned in HPI   Objective  Virtual encounter, vitals obtained at home  Vitals:   08/15/19 1355  BP: (!) 125/92  Pulse: 74    Body mass index is 25.92 kg/m.  Physical Exam  Awake, alert and oriented   Results for orders placed or performed in visit on 08/13/19 (from the past 72 hour(s))  TSH + free T4     Status: None   Collection Time: 08/13/19  9:17 AM  Result Value Ref Range   TSH 2.350 0.450 - 4.500 uIU/mL   Free T4 1.22 0.82 - 1.77 ng/dL    PHQ2/9: Depression screen [facility] 2/9 08/15/2019 06/23/2019 02/21/2019 07/17/2018 02/25/2016  Decreased Interest 0 0 0 0 0  Down, Depressed, Hopeless 0 0 0 0 0  PHQ - 2 Score 0 0 0 0 0  Altered sleeping 0 0 0 1 -  Tired, decreased energy 0 1 0 0 -  Change in appetite 0 0 0 0 -  Feeling bad or failure about yourself  0 0 0 1 -  Trouble concentrating 0 0 0 0 -  Moving slowly or fidgety/restless 0 0 0 0 -  Suicidal thoughts 0 0 0 0 -  PHQ-9 Score 0 1 0 2 -  Difficult doing work/chores Not difficult at all Not difficult at all Not difficult at all Somewhat difficult -   PHQ-2/9 Result is negative.    Fall Risk: Fall Risk  08/15/2019 06/23/2019 03/21/2019 02/21/2019 08/21/2018  Falls in the past year? 0 0 0 0 0  Number falls in past yr: 0 0 0 0 0  Injury with Fall? 0 0 0 0 0     Assessment & Plan  1. Essential hypertension  She still has medication, only taking half pill   2. Microalbuminuria  Continue ARB  3. Chronic tension-type headache, not intractable  Doing well now   4. Vitamin D deficiency  Continue supplementation   5. Metabolic syndrome  She stopped metformin, on life style modification  6. Dyslipidemia   7. GAD (generalized anxiety disorder)  Doing well, on CBD oil prn  I discussed the assessment and treatment plan with the patient. The patient was provided an opportunity to ask questions and all were answered. The  patient agreed with the plan and demonstrated an understanding of the instructions.  The patient was advised to call back or seek an in-person evaluation if the symptoms worsen or if the condition fails to improve as anticipated.  I provided 25 minutes of non-face-to-face time during this encounter.

## 2019-08-16 ENCOUNTER — Encounter: Payer: Self-pay | Admitting: Obstetrics and Gynecology

## 2019-10-23 ENCOUNTER — Other Ambulatory Visit: Payer: Self-pay | Admitting: Family Medicine

## 2019-10-23 DIAGNOSIS — E559 Vitamin D deficiency, unspecified: Secondary | ICD-10-CM

## 2019-11-12 ENCOUNTER — Ambulatory Visit: Payer: Managed Care, Other (non HMO) | Admitting: Family Medicine

## 2019-11-12 ENCOUNTER — Encounter: Payer: Self-pay | Admitting: Family Medicine

## 2019-11-12 ENCOUNTER — Other Ambulatory Visit: Payer: Self-pay

## 2019-11-12 VITALS — BP 122/76 | HR 90 | Temp 97.9°F | Resp 16 | Ht <= 58 in | Wt 128.0 lb

## 2019-11-12 DIAGNOSIS — E8881 Metabolic syndrome: Secondary | ICD-10-CM | POA: Diagnosis not present

## 2019-11-12 DIAGNOSIS — I10 Essential (primary) hypertension: Secondary | ICD-10-CM

## 2019-11-12 DIAGNOSIS — E559 Vitamin D deficiency, unspecified: Secondary | ICD-10-CM

## 2019-11-12 DIAGNOSIS — G5601 Carpal tunnel syndrome, right upper limb: Secondary | ICD-10-CM

## 2019-11-12 DIAGNOSIS — R809 Proteinuria, unspecified: Secondary | ICD-10-CM

## 2019-11-12 DIAGNOSIS — F411 Generalized anxiety disorder: Secondary | ICD-10-CM

## 2019-11-12 DIAGNOSIS — E785 Hyperlipidemia, unspecified: Secondary | ICD-10-CM

## 2019-11-12 NOTE — Progress Notes (Signed)
Name: Kathleen Villanueva   MRN: [id number]    DOB: [birth year]   Date:11/12/2019       Progress Note  Subjective  Chief Complaint  Chief Complaint  Patient presents with  . Hypertension    follow up, BP running 114/84    HPI  HTN: BP was high last year -2020  , she is now on losartan hctz , tolerating medication well, bp has been at goal at home, no dizziness, chest pain or palpitation.   Pre-diabetes: she is no longer drinking sweet beverages, she is also exercising more 2-3 times a week for one hour, taking boot camp . She is cutting down on carbohydrates. Eating fish , more vegetables and lot os spinach. She denies polyphagia, polydipsia or polyuria . We will recheck labs today   Dyslipidemia: she is eating more fish and vegetables , also doing bootcamp classes and we will recheck labs today   Anxiety: she states her father was sick with COVID-19 in Dec,  in January she asked for Lexapro, but she never started , she is using CBD oils prn only used it twice, she states she is feeling much better now. Her father recovered and is doing well now   Carpal tunnel syndrome right; she noticed tingling and numbness over the past couple of weeks, she wakes up in the morning and seems to be worse, she squeezes her hands and gets better. No weakness    Patient Active Problem List   Diagnosis Date Noted  . Metabolic syndrome 78/29/5621  . Dyslipidemia 08/13/2018  . Microalbuminuria 08/13/2018  . GAD (generalized anxiety disorder) 08/25/2016  . Allergy to other foods 11/25/2015  . H/O renal calculi 11/25/2015  . Vitamin D deficiency 11/25/2015  . Hyperlipidemia with low HDL 11/25/2015  . Overweight (BMI 25.0-29.9) 11/04/2015  . Seasonal allergies 11/04/2015  . Chronic constipation 11/04/2015  . Other bursal cyst, left ankle and foot 11/04/2015    Past Surgical History:  Procedure Laterality Date  . AUGMENTATION MAMMAPLASTY Bilateral 07/03/2004  . BREAST ENHANCEMENT SURGERY Bilateral    . COLPOSCOPY  07/1995  . CYSTOSCOPY  2012  . DIAGNOSTIC LAPAROSCOPY  04/13/2000   for chronic RLQ pain; normal anatomy  . EXTRACORPOREAL SHOCK WAVE LITHOTRIPSY    . 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 TUBAL LIGATION      Family History  Problem Relation Age of Onset  . Cancer Mother        cervical  . Cancer Maternal Uncle        Kidney  . Heart attack Father   . Hypertension Father   . Hypercholesterolemia Father   . Gout Father   . Gout Brother   . Breast cancer Cousin 50    Social History   Tobacco Use  . Smoking status: Never Smoker  . Smokeless tobacco: Never Used  Substance Use Topics  . Alcohol use: No    Alcohol/week: 0.0 standard drinks     Current Outpatient Medications:  .  losartan-hydrochlorothiazide (HYZAAR) 50-12.5 MG tablet, Take 0.5-1 tablets by mouth daily., Disp: 90 tablet, Rfl: 0 .  Multiple Vitamin (MULTIVITAMIN) tablet, Take 1 tablet by mouth daily., Disp: , Rfl:  .  Vitamin D, Ergocalciferol, (DRISDOL) 1.25 MG (50000 UT) CAPS capsule, Take 1 capsule (50,000 Units total) by mouth every 7 (seven) days., Disp: 12 capsule, Rfl: 0  No Known Allergies  I personally reviewed active problem list, medication list, allergies, family history, social history, health maintenance, notes from last encounter  with the patient/caregiver today.   ROS  Constitutional: Negative for fever or weight change.  Respiratory: Negative for cough and shortness of breath.   Cardiovascular: Negative for chest pain or palpitations.  Gastrointestinal: Negative for abdominal pain, no bowel changes.  Musculoskeletal: Negative for gait problem or joint swelling.  Skin: Negative for rash.  Neurological: Negative for dizziness or headache.  No other specific complaints in a complete review of systems (except as listed in HPI above).  Objective  Vitals:   11/12/19 0850  BP: 122/76  Pulse: 90  Resp: 16  Temp: 97.9 F (36.6 C)  TempSrc: Temporal  SpO2: 96%   Weight: 128 lb (58.1 kg)  Height: 4' 10"  (1.473 m)    Body mass index is 26.75 kg/m.  Physical Exam  Constitutional: Patient appears well-developed and well-nourished.  No distress.  HEENT: head atraumatic, normocephalic, pupils equal and reactive to light Cardiovascular: Normal rate, regular rhythm and normal heart sounds.  No murmur heard. No BLE edema. Pulmonary/Chest: Effort normal and breath sounds normal. No respiratory distress. Abdominal: Soft.  There is no tenderness. Psychiatric: Patient has a normal mood and affect. behavior is normal. Judgment and thought content normal. Muscular skeletal: no atrophy of hands, tinnels and Phalen's test negative   PHQ2/9: Depression screen [facility] 2/9 11/12/2019 08/15/2019 06/23/2019 02/21/2019 07/17/2018  Decreased Interest 0 0 0 0 0  Down, Depressed, Hopeless 0 0 0 0 0  PHQ - 2 Score 0 0 0 0 0  Altered sleeping 0 0 0 0 1  Tired, decreased energy 0 0 1 0 0  Change in appetite 0 0 0 0 0  Feeling bad or failure about yourself  0 0 0 0 1  Trouble concentrating 0 0 0 0 0  Moving slowly or fidgety/restless 0 0 0 0 0  Suicidal thoughts 0 0 0 0 0  PHQ-9 Score 0 0 1 0 2  Difficult doing work/chores Not difficult at all Not difficult at all Not difficult at all Not difficult at all Somewhat difficult    phq 9 is negative   Fall Risk: Fall Risk  11/12/2019 08/15/2019 06/23/2019 03/21/2019 02/21/2019  Falls in the past year? 0 0 0 0 0  Number falls in past yr: 0 0 0 0 0  Injury with Fall? 0 0 0 0 0  Follow up Falls evaluation completed - - - -     Assessment & Plan  1. Vitamin D deficiency  - VITAMIN D 25 Hydroxy (Vit-D Deficiency, Fractures)  2. Metabolic syndrome  - Hemoglobin A1c  3. Essential hypertension  - CBC with Differential/Platelet - Comprehensive metabolic panel  4. Microalbuminuria  - Microalbumin / creatinine urine ratio  5. Dyslipidemia  - Lipid panel  6. GAD (generalized anxiety disorder)  Doing well now    7. Carpal tunnel syndrome on right  Discussed getting a wrist brace, ice it , discussed when to see ortho

## 2019-11-12 NOTE — Patient Instructions (Signed)
Carpal Tunnel Syndrome  Carpal tunnel syndrome is a condition that causes pain in your hand and arm. The carpal tunnel is a narrow area located on the palm side of your wrist. Repeated wrist motion or certain diseases may cause swelling within the tunnel. This swelling pinches the main nerve in the wrist (median nerve). What are the causes? This condition may be caused by:  Repeated wrist motions.  Wrist injuries.  Arthritis.  A cyst or tumor in the carpal tunnel.  Fluid buildup during pregnancy. Sometimes the cause of this condition is not known. What increases the risk? The following factors may make you more likely to develop this condition:  Having a job, such as being a Research scientist (life sciences), that requires you to repeatedly move your wrist in the same motion.  Being a woman.  Having certain conditions, such as: ? Diabetes. ? Obesity. ? An underactive thyroid (hypothyroidism). ? Kidney failure. What are the signs or symptoms? Symptoms of this condition include:  A tingling feeling in your fingers, especially in your thumb, index, and middle fingers.  Tingling or numbness in your hand.  An aching feeling in your entire arm, especially when your wrist and elbow are bent for a long time.  Wrist pain that goes up your arm to your shoulder.  Pain that goes down into your palm or fingers.  A weak feeling in your hands. You may have trouble grabbing and holding items. Your symptoms may feel worse during the night. How is this diagnosed? This condition is diagnosed with a medical history and physical exam. You may also have tests, including:  Electromyogram (EMG). This test measures electrical signals sent by your nerves into the muscles.  Nerve conduction study. This test measures how well electrical signals pass through your nerves.  Imaging tests, such as X-rays, ultrasound, and MRI. These tests check for possible causes of your condition. How is this treated? This  condition may be treated with:  Lifestyle changes. It is important to stop or change the activity that caused your condition.  Doing exercise and activities to strengthen your muscles and bones (physical therapy).  Learning how to use your hand again after diagnosis (occupational therapy).  Medicines for pain and inflammation. This may include medicine that is injected into your wrist.  A wrist splint.  Surgery. Follow these instructions at home: If you have a splint:  Wear the splint as told by your health care provider. Remove it only as told by your health care provider.  Loosen the splint if your fingers tingle, become numb, or turn cold and blue.  Keep the splint clean.  If the splint is not waterproof: ? Do not let it get wet. ? Cover it with a watertight covering when you take a bath or shower. Managing pain, stiffness, and swelling   If directed, put ice on the painful area: ? If you have a removable splint, remove it as told by your health care provider. ? Put ice in a plastic bag. ? Place a towel between your skin and the bag. ? Leave the ice on for 20 minutes, 2-3 times per day. General instructions  Take over-the-counter and prescription medicines only as told by your health care provider.  Rest your wrist from any activity that may be causing your pain. If your condition is work related, talk with your employer about changes that can be made, such as getting a wrist pad to use while typing.  Do any exercises as told  by your health care provider, physical therapist, or occupational therapist.  Keep all follow-up visits as told by your health care provider. This is important. Contact a health care provider if:  You have new symptoms.  Your pain is not controlled with medicines.  Your symptoms get worse. Get help right away if:  You have severe numbness or tingling in your wrist or hand. Summary  Carpal tunnel syndrome is a condition that causes pain in  your hand and arm.  It is usually caused by repeated wrist motions.  Lifestyle changes and medicines are used to treat carpal tunnel syndrome. Surgery may be recommended.  Follow your health care provider's instructions about wearing a splint, resting from activity, keeping follow-up visits, and calling for help. This information is not intended to replace advice given to you by your health care provider. Make sure you discuss any questions you have with your health care provider. Document Revised: 10/26/2017 Document Reviewed: 10/26/2017 Elsevier Patient Education  [place].

## 2019-11-13 LAB — CBC WITH DIFFERENTIAL/PLATELET
Basophils Absolute: 0.1 10*3/uL (ref 0.0–0.2)
Basos: 1 %
EOS (ABSOLUTE): 0.3 10*3/uL (ref 0.0–0.4)
Eos: 4 %
Hematocrit: 46.4 % (ref 34.0–46.6)
Hemoglobin: 15.1 g/dL (ref 11.1–15.9)
Immature Grans (Abs): 0 10*3/uL (ref 0.0–0.1)
Immature Granulocytes: 1 %
Lymphocytes Absolute: 2.9 10*3/uL (ref 0.7–3.1)
Lymphs: 44 %
MCH: 28.8 pg (ref 26.6–33.0)
MCHC: 32.5 g/dL (ref 31.5–35.7)
MCV: 88 fL (ref 79–97)
Monocytes Absolute: 0.5 10*3/uL (ref 0.1–0.9)
Monocytes: 8 %
Neutrophils Absolute: 2.7 10*3/uL (ref 1.4–7.0)
Neutrophils: 42 %
Platelets: 272 10*3/uL (ref 150–450)
RBC: 5.25 x10E6/uL (ref 3.77–5.28)
RDW: 12.7 % (ref 11.7–15.4)
WBC: 6.5 10*3/uL (ref 3.4–10.8)

## 2019-11-13 LAB — LIPID PANEL
Chol/HDL Ratio: 5.3 ratio — ABNORMAL HIGH (ref 0.0–4.4)
Cholesterol, Total: 208 mg/dL — ABNORMAL HIGH (ref 100–199)
HDL: 39 mg/dL — ABNORMAL LOW (ref >39)
LDL Chol Calc (NIH): 129 mg/dL — ABNORMAL HIGH (ref 0–99)
Triglycerides: 223 mg/dL — ABNORMAL HIGH (ref 0–149)
VLDL Cholesterol Cal: 40 mg/dL (ref 5–40)

## 2019-11-13 LAB — MICROALBUMIN / CREATININE URINE RATIO
Creatinine, Urine: 77.2 mg/dL
Microalb/Creat Ratio: 345 mg/g creat — ABNORMAL HIGH (ref 0–29)
Microalbumin, Urine: 266.6 ug/mL

## 2019-11-13 LAB — COMPREHENSIVE METABOLIC PANEL
ALT: 21 IU/L (ref 0–32)
AST: 19 IU/L (ref 0–40)
Albumin/Globulin Ratio: 1.4 (ref 1.2–2.2)
Albumin: 4.6 g/dL (ref 3.8–4.8)
Alkaline Phosphatase: 39 IU/L (ref 39–117)
BUN/Creatinine Ratio: 11 (ref 9–23)
BUN: 10 mg/dL (ref 6–24)
Bilirubin Total: 0.3 mg/dL (ref 0.0–1.2)
CO2: 23 mmol/L (ref 20–29)
Calcium: 9.6 mg/dL (ref 8.7–10.2)
Chloride: 103 mmol/L (ref 96–106)
Creatinine, Ser: 0.9 mg/dL (ref 0.57–1.00)
GFR calc Af Amer: 88 mL/min/{1.73_m2} (ref >59)
GFR calc non Af Amer: 76 mL/min/{1.73_m2} (ref >59)
Globulin, Total: 3.3 g/dL (ref 1.5–4.5)
Glucose: 125 mg/dL — ABNORMAL HIGH (ref 65–99)
Potassium: 4.8 mmol/L (ref 3.5–5.2)
Sodium: 139 mmol/L (ref 134–144)
Total Protein: 7.9 g/dL (ref 6.0–8.5)

## 2019-11-13 LAB — HEMOGLOBIN A1C
Est. average glucose Bld gHb Est-mCnc: 128 mg/dL
Hgb A1c MFr Bld: 6.1 % — ABNORMAL HIGH (ref 4.8–5.6)

## 2019-11-13 LAB — VITAMIN D 25 HYDROXY (VIT D DEFICIENCY, FRACTURES): Vit D, 25-Hydroxy: 30 ng/mL (ref 30.0–100.0)

## 2019-12-04 NOTE — Progress Notes (Signed)
Gynecology Annual Exam  PCP: Steele Sizer, MD  Chief Complaint:  Chief Complaint  Patient presents with  . Gynecologic Exam  . [employer] Employee    History of Present Illness: Kathleen Villanueva is a 49 y.o. [phone] Asian female who presents for her annual gyn exam. The patient has no gyn complaints today.  LMP: Patient's last menstrual period was 11/08/2019 (approximate). Menses are usually monthly, lasting 7 days with 1 heavier days requiring pad changes q 2-3 hours. Did not have a menses in January 2021 ( Father was hospitalized with Covid around that time).  Has dysmenorrhea with heavier flow that is relieved with Advil 200 mgm x1. Her current form of contraception is a BTL. Denies vasomotor sympoms. Since her last annual 12/02/2018, she was started on Losartan-HCTZ for hypertension. She has a history of vitamin D deficiency and has been taking vitamin D supplements. Her last vitamin D level was WNL.  Does not get adequate calcium in her diet. Has added Almond milk to her diet along with feta cheese and spinach and broccoli to increase intake. She also has metabolic syndrome and is prediabetic. Her hemoglobin A1C has decreased from 6.3% to 6.1% over the last year with increased exercise [facility] three times a week.) and decreasing sweets/ CHO. She thinks she had Covid infection in early 2020. She does not smoke or drink alcohol.  Her last Pap smear was 12/02/2018 and was ASCUS/ negative HRHPV. Remote hx of CIN1 treated with cryo 1997 Her last mammogram was 07/25/2019 and was negative. She does monthly SBEs. She has had breast augmentation surgery. There is a family history of breast cancer in her maternal cousin at age 49. Genetic testing has not been done. There is no family history of ovarian cancer in her family.  Her screening for cholesterol and diabetes has been done by her PCP this month. Her lipid panel in borderline elevated. Colon cancer screening: Last year she had a negative  FIT test.  Review of Systems: Review of Systems  Constitutional: Negative for chills, fever and weight loss.  HENT: Negative for congestion, sinus pain and sore throat.   Eyes: Negative for blurred vision and pain.  Respiratory: Negative for hemoptysis, shortness of breath and wheezing.   Cardiovascular: Negative for chest pain, palpitations and leg swelling.  Gastrointestinal: Negative for abdominal pain, blood in stool, diarrhea, heartburn, nausea and vomiting.  Genitourinary: Negative for dysuria, frequency, hematuria and urgency.  Musculoskeletal: Negative for back pain, joint pain and myalgias.  Skin: Negative for itching and rash.  Neurological: Negative for dizziness, tingling and headaches.  Endo/Heme/Allergies: Positive for environmental allergies (positive for sneezing). Negative for polydipsia. Does not bruise/bleed easily.       Negative for hirsutism   Psychiatric/Behavioral: Negative for depression. The patient is not nervous/anxious and does not have insomnia.     Past Medical History:  Past Medical History:  Diagnosis Date  . Allergy   . Anxiety   . History of gestational diabetes 2006/ 2009   with second and third pregnancy  . History of nephrolithiasis    seen by Dr. Jacqlyn Larsen Urologist   . History of pre-eclampsia    with all pregnancies  . Hypertension   . Over weight   . Sprain of unspecified ligament of left ankle, sequela     Past Surgical History:  Past Surgical History:  Procedure Laterality Date  . AUGMENTATION MAMMAPLASTY Bilateral 07/03/2004  . BREAST ENHANCEMENT SURGERY Bilateral   . COLPOSCOPY  07/1995  . CYSTOSCOPY  2012  . DIAGNOSTIC LAPAROSCOPY  04/13/2000   for chronic RLQ pain; normal anatomy  . EXTRACORPOREAL SHOCK WAVE LITHOTRIPSY    . 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 TUBAL LIGATION       Obstetric History: [id number]  Family History:  Family History  Problem Relation Age of Onset  . Cancer Mother        cervical  .  Cancer Maternal Uncle        Kidney  . Heart attack Father   . Hypertension Father   . Hypercholesterolemia Father   . Gout Father   . Gout Brother   . Breast cancer Cousin 25    Social History:  Social History   Socioeconomic History  . Marital status: Married    Spouse name: Not on file  . Number of children: 3  . Years of education: Not on file  . Highest education level: Not on file  Occupational History  . Occupation: Management consultant     Comment: [employer]   Tobacco Use  . Smoking status: Never Smoker  . Smokeless tobacco: Never Used  Substance and Sexual Activity  . Alcohol use: No    Alcohol/week: 0.0 standard drinks  . Drug use: No  . Sexual activity: Yes    Partners: Male    Birth control/protection: Surgical    Comment: Tubal ligation  Other Topics Concern  . Not on file  Social History Narrative   She works at [employer] for many years, but had a promotion Nov 2018 and states wants to go down again, too stressed    She is budist   Social Determinants of Radio broadcast assistant Strain:   . Difficulty of Paying Living Expenses:   Food Insecurity:   . Worried About Charity fundraiser in the Last Year:   . Arboriculturist in the Last Year:   Transportation Needs: No Transportation Needs  . Lack of Transportation (Medical): No  . Lack of Transportation (Non-Medical): No  Physical Activity: Insufficiently Active  . Days of Exercise per Week: 2 days  . Minutes of Exercise per Session: 30 min  Stress: No Stress Concern Present  . Feeling of Stress : Not at all  Social Connections: Slightly Isolated  . Frequency of Communication with Friends and Family: More than three times a week  . Frequency of Social Gatherings with Friends and Family: More than three times a week  . Attends Religious Services: More than 4 times per year  . Active Member of Clubs or Organizations: No  . Attends Archivist Meetings: Never  . Marital Status: Married    Human resources officer Violence: Not At Risk  . Fear of Current or Ex-Partner: No  . Emotionally Abused: No  . Physically Abused: No  . Sexually Abused: No   OB History  Gravida Para Term Preterm AB Living  5 3 1 2 2 3   SAB TAB Ectopic Multiple Live Births  2       3    # Outcome Date GA Lbr Len/2nd Weight Sex Delivery Anes PTL Lv  5 Preterm 12/17/07 [redacted]w[redacted]d 5 lb 12 oz (2.608 kg) F Vag-Spont  N LIV     Birth Comments: increased Down syndrome risk-normal chromosomes on amniocentesis.     Complications: Preeclampsia, Gestational diabetes  4 Preterm 05/13/05 [id number] lb 5 oz (2.41 kg) M Vag-Spont  N LIV  Birth Comments: IOL for severe preeclampsia     Complications: Severe preeclampsia, Gestational diabetes  3 Term 02/05/95 [redacted]w[redacted]d 6 lb 6 oz (2.892 kg) M Vag-Spont   LIV     Complications: Chorioamnionitis, Hypertension in pregnancy, preeclampsia, severe, delivered/postpartum  2 SAB      SAB     1 SAB             Obstetric Comments  All labors were induced for preeclampsia-received magnesium sulfate with G1 and G2   Allergies:  No Known Allergies  Medications: Vitmain D3 2000 IU daily  Physical Exam Vitals: BP 118/80   Ht 4' 9"  (1.448 m)   Wt 127 lb (57.6 kg)   LMP 11/08/2019 (Approximate)   BMI 27.48 kg/m  General: Asian female in NAD HEENT: normocephalic, anicteric Thyroid: no enlargement, no palpable nodules Pulmonary: No increased work of breathing, CTAB Cardiovascular: RRR without murmur Breast: Breast symmetrical, s/p breast augmentation, no tenderness, no palpable nodules or masses, no skin or nipple retraction present, no nipple discharge.  No axillary, infraclavicular,  or supraclavicular lymphadenopathy. Abdomen: soft, non-tender, non-distended.  Umbilicus without lesions.  No hepatomegaly, or masses palpable. No evidence of hernia  Genitourinary:  External: Normal external female genitalia.  Normal urethral meatus, normal Bartholin's and Skene's glands.    Vagina:  Normal vaginal mucosa, no evidence of prolapse.  Cervix: Grossly normal in appearance, not friable  Uterus: AV, NSSC,  mobile, NT  Adnexa: ovaries non-enlarged, no adnexal masses  Rectal: deferred  Lymphatic: no evidence of inguinal lymphadenopathy Extremities: no edema, erythema, or tenderness Neurologic: Grossly intact Psychiatric: mood appropriate, affect full    Assessment: Well woman gyn exam ASCUS Pap with negative HRHPV last year. Plan:  1) Mammogram  - recommend yearly screening mammogram - next mammogram due at after 07/24/2020  2) Cervical cancer screening: Pap smear done  3) Osteoporosis prevention  Discussed calcium and vitamin D3 requirements. Continue vitamin D3 supplements  4) Routine healthcare maintenance including cholesterol, diabetes screening thru her PCP.  5) Colon cancer screening: Discussed options including colonoscopy, annual FIT testing or Cologuard. She desires to continue with annual  FIT testing. Given home collection kit and instructions on how to collect.   6) Follow up 1 year for routine annual  CDalia Heading [place]

## 2019-12-05 ENCOUNTER — Other Ambulatory Visit: Payer: Self-pay

## 2019-12-05 ENCOUNTER — Encounter: Payer: Self-pay | Admitting: Certified Nurse Midwife

## 2019-12-05 ENCOUNTER — Ambulatory Visit (INDEPENDENT_AMBULATORY_CARE_PROVIDER_SITE_OTHER): Payer: Managed Care, Other (non HMO) | Admitting: Certified Nurse Midwife

## 2019-12-05 VITALS — BP 118/80 | Ht <= 58 in | Wt 127.0 lb

## 2019-12-05 DIAGNOSIS — Z124 Encounter for screening for malignant neoplasm of cervix: Secondary | ICD-10-CM

## 2019-12-05 DIAGNOSIS — Z1231 Encounter for screening mammogram for malignant neoplasm of breast: Secondary | ICD-10-CM

## 2019-12-05 DIAGNOSIS — Z87410 Personal history of cervical dysplasia: Secondary | ICD-10-CM | POA: Diagnosis not present

## 2019-12-05 DIAGNOSIS — R87610 Atypical squamous cells of undetermined significance on cytologic smear of cervix (ASC-US): Secondary | ICD-10-CM

## 2019-12-05 DIAGNOSIS — Z1211 Encounter for screening for malignant neoplasm of colon: Secondary | ICD-10-CM

## 2019-12-05 DIAGNOSIS — Z01419 Encounter for gynecological examination (general) (routine) without abnormal findings: Secondary | ICD-10-CM

## 2019-12-09 LAB — IGP, APTIMA HPV: HPV Aptima: NEGATIVE

## 2019-12-24 LAB — FECAL OCCULT BLOOD, IMMUNOCHEMICAL: Fecal Occult Bld: NEGATIVE

## 2020-02-02 ENCOUNTER — Other Ambulatory Visit: Payer: Self-pay | Admitting: Family Medicine

## 2020-02-02 DIAGNOSIS — E559 Vitamin D deficiency, unspecified: Secondary | ICD-10-CM

## 2020-02-02 DIAGNOSIS — I10 Essential (primary) hypertension: Secondary | ICD-10-CM

## 2020-02-02 MED ORDER — VITAMIN D (ERGOCALCIFEROL) 1.25 MG (50000 UNIT) PO CAPS: 50000.0000 [IU] | ORAL_CAPSULE | ORAL | 0 refills | Status: DC

## 2020-02-02 MED ORDER — LOSARTAN POTASSIUM-HCTZ 50-12.5 MG PO TABS: 0.5000 | ORAL_TABLET | Freq: Every day | ORAL | 0 refills | Status: DC

## 2020-05-01 ENCOUNTER — Other Ambulatory Visit: Payer: Self-pay | Admitting: Family Medicine

## 2020-05-01 DIAGNOSIS — I10 Essential (primary) hypertension: Secondary | ICD-10-CM

## 2020-05-01 DIAGNOSIS — E559 Vitamin D deficiency, unspecified: Secondary | ICD-10-CM

## 2020-05-11 NOTE — Progress Notes (Signed)
Name: Kathleen Villanueva   MRN: [id number]    DOB: 11-28-1970   Date:05/14/2020       Progress Note  Subjective  Chief Complaint  Annual Exam  HPI  Patient presents for annual CPE and follow up   HTN: BP was high last year -2020  , she is now on losartan hctz , tolerating medication well, bp has been at goal at home, she sates stress is down, she denies chest pain, palpitation or dizziness.   Pre-diabetes: she is also exercising more 2-3 times a week for one hour, she took a break from [employer] because of kids playing sports at night. She has jogging for about 25 minutes three days a week, advised to go up to 5 days a weeks . She has been eating a balanced diet, avoiding sweets, she does not drink sodas, and only occasionally drinks sweet teas. She denies polyphagia, polydipsia or polyuria .   Dyslipidemia: she is eating more fish and vegetables , we will recheck level  Anxiety: she states doing much better, she states she used CBD oils for a few days back in December because she was worried about her father, but since than she has been doing well   Carpal tunnel syndrome right; she noticed tingling and numbness around May 2021, she states doing well since she had a couple of weeks off from work. Discussed wearing a brace     Diet: balance, enough calcium  Exercise: discussed increasing to 150 minutes per week     Office Visit from 11/12/2019 in [facility]  AUDIT-C Score 0     Depression: Phq 9 is  negative Depression screen [facility] 2/9 05/14/2020 05/14/2020 11/12/2019 08/15/2019 06/23/2019  Decreased Interest 0 0 0 0 0  Down, Depressed, Hopeless 0 0 0 0 0  PHQ - 2 Score 0 0 0 0 0  Altered sleeping 0 - 0 0 0  Tired, decreased energy 0 - 0 0 1  Change in appetite 0 - 0 0 0  Feeling bad or failure about yourself  0 - 0 0 0  Trouble concentrating 0 - 0 0 0  Moving slowly or fidgety/restless 0 - 0 0 0  Suicidal thoughts 0 - 0 0 0  PHQ-9 Score 0 - 0 0 1   Difficult doing work/chores Not difficult at all - Not difficult at all Not difficult at all Not difficult at all   Hypertension: BP Readings from Last 3 Encounters:  05/14/20 122/84  12/05/19 118/80  11/12/19 122/76   Obesity: Wt Readings from Last 3 Encounters:  05/14/20 126 lb 14.4 oz (57.6 kg)  12/05/19 127 lb (57.6 kg)  11/12/19 128 lb (58.1 kg)   BMI Readings from Last 3 Encounters:  05/14/20 27.46 kg/m  12/05/19 27.48 kg/m  11/12/19 26.75 kg/m     Vaccines:   Flu: Patient declined, educated and discussed with patient.  Hep C Screening: Ordered at today's visit STD testing and prevention (HIV/chl/gon/syphilis): not interested  Intimate partner violence: negative screen  Sexual History : no problems  Menstrual History/LMP/Abnormal Bleeding: regular , LMP 04/30/2020 , discussed peri-menopausal symptoms  Incontinence Symptoms: no problems   Breast cancer:  - Last Mammogram: 07/25/2019 - BRCA gene screening: N/A  Osteoporosis: Discussed high calcium and vitamin D supplementation, weight bearing exercises  Cervical cancer screening: 12/05/2019 by Melina Modena Side   Skin cancer: Discussed monitoring for atypical lesions  Colorectal cancer: up to date Fit test negative 11/2019  ECG: 07/11/2018  Advanced  Care Planning: A voluntary discussion about advance care planning including the explanation and discussion of advance directives.  Discussed health care proxy and Living will, and the patient was able to identify a health care proxy as husband .  Patient does not have a living will at present time.   Lipids: Lab Results  Component Value Date   CHOL 208 (H) 11/12/2019   CHOL 219 (H) 03/21/2019   CHOL 210 (H) 08/12/2018   Lab Results  Component Value Date   HDL 39 (L) 11/12/2019   HDL 33 (L) 03/21/2019   HDL 34 (L) 08/12/2018   Lab Results  Component Value Date   LDLCALC 129 (H) 11/12/2019   LDLCALC 123 (H) 03/21/2019   LDLCALC 101 (H) 08/12/2018   Lab  Results  Component Value Date   TRIG 223 (H) 11/12/2019   TRIG 353 (H) 03/21/2019   TRIG 376 (H) 08/12/2018   Lab Results  Component Value Date   CHOLHDL 5.3 (H) 11/12/2019   CHOLHDL 6.6 (H) 03/21/2019   CHOLHDL 6.2 (H) 08/12/2018   No results found for: LDLDIRECT  Glucose: Glucose  Date Value Ref Range Status  11/12/2019 125 (H) 65 - 99 mg/dL Final  03/21/2019 133 (H) 65 - 99 mg/dL Final  08/12/2018 131 (H) 65 - 99 mg/dL Final   Glucose, Bld  Date Value Ref Range Status  07/11/2018 102 (H) 70 - 99 mg/dL Final    Patient Active Problem List   Diagnosis Date Noted   Metabolic syndrome 78/29/5621   Dyslipidemia 08/13/2018   Microalbuminuria 08/13/2018   GAD (generalized anxiety disorder) 08/25/2016   Allergy to other foods 11/25/2015   H/O renal calculi 11/25/2015   Vitamin D deficiency 11/25/2015   Hyperlipidemia with low HDL 11/25/2015   Overweight (BMI 25.0-29.9) 11/04/2015   Seasonal allergies 11/04/2015   Chronic constipation 11/04/2015   Other bursal cyst, left ankle and foot 11/04/2015    Past Surgical History:  Procedure Laterality Date   AUGMENTATION MAMMAPLASTY Bilateral 07/03/2004   BREAST ENHANCEMENT SURGERY Bilateral    COLPOSCOPY  07/1995   CYSTOSCOPY  2012   DIAGNOSTIC LAPAROSCOPY  04/13/2000   for chronic RLQ pain; normal anatomy   EXTRACORPOREAL SHOCK WAVE LITHOTRIPSY     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   TUBAL LIGATION      Family History  Problem Relation Age of Onset   Cancer Mother        cervical   Cancer Maternal Uncle        Kidney   Heart attack Father    Hypertension Father    Hypercholesterolemia Father    Gout Father    Gout Brother    Breast cancer Cousin 65    Social History   Socioeconomic History   Marital status: Married    Spouse name: Not on file   Number of children: 3   Years of education: Not on file   Highest education level: Not on file  Occupational History    Occupation: Management consultant     Comment: [employer]   Tobacco Use   Smoking status: Never Smoker   Smokeless tobacco: Never Used  Scientific laboratory technician Use: Never used  Substance and Sexual Activity   Alcohol use: No    Alcohol/week: 0.0 standard drinks   Drug use: No   Sexual activity: Yes    Partners: Male    Birth control/protection: Surgical    Comment: Tubal ligation  Other Topics Concern  Not on file  Social History Narrative   She works at [employer] for many years, but had a promotion Nov 2018 and states wants to go down again, too stressed    She is budist   Social Determinants of Radio broadcast assistant Strain: Low Risk    Difficulty of Paying Living Expenses: Not hard at all  Food Insecurity: No Food Insecurity   Worried About Charity fundraiser in the Last Year: Never true   Arboriculturist in the Last Year: Never true  Transportation Needs: No Transportation Needs   Lack of Transportation (Medical): No   Lack of Transportation (Non-Medical): No  Physical Activity: Insufficiently Active   Days of Exercise per Week: 3 days   Minutes of Exercise per Session: 30 min  Stress: No Stress Concern Present   Feeling of Stress : Not at all  Social Connections: Unknown   Frequency of Communication with Friends and Family: More than three times a week   Frequency of Social Gatherings with Friends and Family: More than three times a week   Attends Religious Services: Patient refused   Marine scientist or Organizations: No   Attends Archivist Meetings: Never   Marital Status: Married  Human resources officer Violence: Not At Risk   Fear of Current or Ex-Partner: No   Emotionally Abused: No   Physically Abused: No   Sexually Abused: No     Current Outpatient Medications:    losartan-hydrochlorothiazide (HYZAAR) 50-12.5 MG tablet, TAKE 1/2 TO 1 TABLET BY MOUTH DAILY, Disp: 90 tablet, Rfl: 0   Multiple Vitamin (MULTIVITAMIN)  tablet, Take 1 tablet by mouth daily., Disp: , Rfl:    Vitamin D, Ergocalciferol, (DRISDOL) 1.25 MG (50000 UNIT) CAPS capsule, TAKE 1 CAPSULE BY MOUTH EVERY 7 DAYS, Disp: 12 capsule, Rfl: 0   amoxicillin (AMOXIL) 500 MG tablet, Take 500 mg by mouth 3 (three) times daily. (Patient not taking: Reported on 05/14/2020), Disp: , Rfl:    metFORMIN (GLUCOPHAGE-XR) 500 MG 24 hr tablet, Take 500 mg by mouth daily. (Patient not taking: Reported on 05/14/2020), Disp: , Rfl:   No Known Allergies   ROS  Constitutional: Negative for fever or weight change.  Respiratory: Negative for cough and shortness of breath.   Cardiovascular: Negative for chest pain or palpitations.  Gastrointestinal: Negative for abdominal pain, no bowel changes.  Musculoskeletal: Negative for gait problem or joint swelling.  Skin: Negative for rash.  Neurological: Negative for dizziness or headache.  No other specific complaints in a complete review of systems (except as listed in HPI above).  Objective  Vitals:   05/14/20 0806  BP: 122/84  Pulse: 91  Resp: 16  Temp: 98 F (36.7 C)  TempSrc: Oral  SpO2: 100%  Weight: 126 lb 14.4 oz (57.6 kg)  Height: 4' 9"  (1.448 m)    Body mass index is 27.46 kg/m.  Physical Exam  Constitutional: Patient appears well-developed and well-nourished. No distress.  HENT: Head: Normocephalic and atraumatic. Ears: B TMs ok, no erythema or effusion; Nose: Not done. Mouth/Throat: not done  Eyes: Conjunctivae and EOM are normal. Pupils are equal, round, and reactive to light. No scleral icterus.  Neck: Normal range of motion. Neck supple. No JVD present. No thyromegaly present.  Cardiovascular: Normal rate, regular rhythm and normal heart sounds.  No murmur heard. No BLE edema. Pulmonary/Chest: Effort normal and breath sounds normal. No respiratory distress. Abdominal: Soft. Bowel sounds are normal, no distension. There is  no tenderness. no masses Breast: no lumps or masses, no  nipple discharge or rashes FEMALE GENITALIA:  Not done  RECTAL: not done Musculoskeletal: Normal range of motion, no joint effusions. No gross deformities Neurological: he is alert and oriented to person, place, and time. No cranial nerve deficit. Coordination, balance, strength, speech and gait are normal.  Skin: Skin is warm and dry. No rash noted. No erythema.  Psychiatric: Patient has a normal mood and affect. behavior is normal. Judgment and thought content normal.  Fall Risk: Fall Risk  05/14/2020 11/12/2019 08/15/2019 06/23/2019 03/21/2019  Falls in the past year? 0 0 0 0 0  Number falls in past yr: 0 0 0 0 0  Injury with Fall? 0 0 0 0 0  Follow up - Falls evaluation completed - - -     Functional Status Survey: Is the patient deaf or have difficulty hearing?: No Does the patient have difficulty seeing, even when wearing glasses/contacts?: No Does the patient have difficulty concentrating, remembering, or making decisions?: No Does the patient have difficulty walking or climbing stairs?: No Does the patient have difficulty dressing or bathing?: No Does the patient have difficulty doing errands alone such as visiting a doctor's office or shopping?: No   Assessment & Plan  1. Essential hypertension  - CBC with Differential/Platelet - Comprehensive metabolic panel  2. Metabolic syndrome  - Hemoglobin A1c  3. Well woman exam   4. GAD (generalized anxiety disorder)   5. Need for hepatitis C screening test  - Hepatitis C antibody  6. Dyslipidemia  - Lipid panel  7. Carpal tunnel syndrome on right   8. Vitamin D deficiency  - VITAMIN D 25 Hydroxy (Vit-D Deficiency, Fractures)  9. Microalbuminuria  Recheck level  10. Chronic tension-type headache, not intractable  Doing well now  -USPSTF grade A and B recommendations reviewed with patient; age-appropriate recommendations, preventive care, screening tests, etc discussed and encouraged; healthy living  encouraged; see AVS for patient education given to patient -Discussed importance of 150 minutes of physical activity weekly, eat two servings of fish weekly, eat one serving of tree nuts ( cashews, pistachios, pecans, almonds.Marland Kitchen) every other day, eat 6 servings of fruit/vegetables daily and drink plenty of water and avoid sweet beverages.

## 2020-05-14 ENCOUNTER — Other Ambulatory Visit: Payer: Self-pay

## 2020-05-14 ENCOUNTER — Encounter: Payer: Self-pay | Admitting: Family Medicine

## 2020-05-14 ENCOUNTER — Ambulatory Visit (INDEPENDENT_AMBULATORY_CARE_PROVIDER_SITE_OTHER): Payer: Managed Care, Other (non HMO) | Admitting: Family Medicine

## 2020-05-14 VITALS — BP 122/84 | HR 91 | Temp 98.0°F | Resp 16 | Ht <= 58 in | Wt 126.9 lb

## 2020-05-14 DIAGNOSIS — Z1159 Encounter for screening for other viral diseases: Secondary | ICD-10-CM | POA: Diagnosis not present

## 2020-05-14 DIAGNOSIS — R809 Proteinuria, unspecified: Secondary | ICD-10-CM

## 2020-05-14 DIAGNOSIS — F411 Generalized anxiety disorder: Secondary | ICD-10-CM | POA: Diagnosis not present

## 2020-05-14 DIAGNOSIS — E8881 Metabolic syndrome: Secondary | ICD-10-CM

## 2020-05-14 DIAGNOSIS — Z01419 Encounter for gynecological examination (general) (routine) without abnormal findings: Secondary | ICD-10-CM | POA: Diagnosis not present

## 2020-05-14 DIAGNOSIS — G44229 Chronic tension-type headache, not intractable: Secondary | ICD-10-CM

## 2020-05-14 DIAGNOSIS — E559 Vitamin D deficiency, unspecified: Secondary | ICD-10-CM

## 2020-05-14 DIAGNOSIS — I10 Essential (primary) hypertension: Secondary | ICD-10-CM

## 2020-05-14 DIAGNOSIS — E785 Hyperlipidemia, unspecified: Secondary | ICD-10-CM

## 2020-05-14 DIAGNOSIS — G5601 Carpal tunnel syndrome, right upper limb: Secondary | ICD-10-CM

## 2020-05-14 NOTE — Patient Instructions (Signed)

## 2020-07-02 LAB — CBC WITH DIFFERENTIAL/PLATELET
Basophils Absolute: 0.1 10*3/uL (ref 0.0–0.2)
Basos: 1 %
EOS (ABSOLUTE): 0.5 10*3/uL — ABNORMAL HIGH (ref 0.0–0.4)
Eos: 7 %
Hematocrit: 43.8 % (ref 34.0–46.6)
Hemoglobin: 14.6 g/dL (ref 11.1–15.9)
Immature Grans (Abs): 0 10*3/uL (ref 0.0–0.1)
Immature Granulocytes: 0 %
Lymphocytes Absolute: 3.1 10*3/uL (ref 0.7–3.1)
Lymphs: 44 %
MCH: 28.3 pg (ref 26.6–33.0)
MCHC: 33.3 g/dL (ref 31.5–35.7)
MCV: 85 fL (ref 79–97)
Monocytes Absolute: 0.5 10*3/uL (ref 0.1–0.9)
Monocytes: 7 %
Neutrophils Absolute: 3 10*3/uL (ref 1.4–7.0)
Neutrophils: 41 %
Platelets: 274 10*3/uL (ref 150–450)
RBC: 5.15 x10E6/uL (ref 3.77–5.28)
RDW: 12.8 % (ref 11.7–15.4)
WBC: 7.2 10*3/uL (ref 3.4–10.8)

## 2020-07-02 LAB — COMPREHENSIVE METABOLIC PANEL
ALT: 22 IU/L (ref 0–32)
AST: 17 IU/L (ref 0–40)
Albumin/Globulin Ratio: 1.2 (ref 1.2–2.2)
Albumin: 4.2 g/dL (ref 3.8–4.8)
Alkaline Phosphatase: 36 IU/L — ABNORMAL LOW (ref 44–121)
BUN/Creatinine Ratio: 21 (ref 9–23)
BUN: 18 mg/dL (ref 6–24)
Bilirubin Total: 0.4 mg/dL (ref 0.0–1.2)
CO2: 22 mmol/L (ref 20–29)
Calcium: 9.6 mg/dL (ref 8.7–10.2)
Chloride: 102 mmol/L (ref 96–106)
Creatinine, Ser: 0.87 mg/dL (ref 0.57–1.00)
GFR calc Af Amer: 91 mL/min/{1.73_m2} (ref >59)
GFR calc non Af Amer: 79 mL/min/{1.73_m2} (ref >59)
Globulin, Total: 3.5 g/dL (ref 1.5–4.5)
Glucose: 166 mg/dL — ABNORMAL HIGH (ref 65–99)
Potassium: 4.3 mmol/L (ref 3.5–5.2)
Sodium: 139 mmol/L (ref 134–144)
Total Protein: 7.7 g/dL (ref 6.0–8.5)

## 2020-07-02 LAB — LIPID PANEL
Chol/HDL Ratio: 6.8 ratio — ABNORMAL HIGH (ref 0.0–4.4)
Cholesterol, Total: 244 mg/dL — ABNORMAL HIGH (ref 100–199)
HDL: 36 mg/dL — ABNORMAL LOW (ref >39)
LDL Chol Calc (NIH): 154 mg/dL — ABNORMAL HIGH (ref 0–99)
Triglycerides: 288 mg/dL — ABNORMAL HIGH (ref 0–149)
VLDL Cholesterol Cal: 54 mg/dL — ABNORMAL HIGH (ref 5–40)

## 2020-07-02 LAB — HEMOGLOBIN A1C
Est. average glucose Bld gHb Est-mCnc: 146 mg/dL
Hgb A1c MFr Bld: 6.7 % — ABNORMAL HIGH (ref 4.8–5.6)

## 2020-07-02 LAB — MICROALBUMIN / CREATININE URINE RATIO
Creatinine, Urine: 149.7 mg/dL
Microalb/Creat Ratio: 153 mg/g creat — ABNORMAL HIGH (ref 0–29)
Microalbumin, Urine: 228.3 ug/mL

## 2020-07-02 LAB — HEPATITIS C ANTIBODY: Hep C Virus Ab: 0.1 s/co ratio (ref 0.0–0.9)

## 2020-07-02 LAB — VITAMIN D 25 HYDROXY (VIT D DEFICIENCY, FRACTURES): Vit D, 25-Hydroxy: 19.6 ng/mL — ABNORMAL LOW (ref 30.0–100.0)

## 2020-07-16 ENCOUNTER — Ambulatory Visit (INDEPENDENT_AMBULATORY_CARE_PROVIDER_SITE_OTHER): Payer: Managed Care, Other (non HMO) | Admitting: Internal Medicine

## 2020-07-16 DIAGNOSIS — J069 Acute upper respiratory infection, unspecified: Secondary | ICD-10-CM | POA: Diagnosis not present

## 2020-07-16 DIAGNOSIS — Z209 Contact with and (suspected) exposure to unspecified communicable disease: Secondary | ICD-10-CM | POA: Diagnosis not present

## 2020-07-16 NOTE — Progress Notes (Signed)
Name: Kathleen Villanueva   MRN: [id number]    DOB: Nov 14, 1970   Date:07/16/2020       Progress Note  Subjective  Chief Complaint  Chief Complaint  Patient presents with  . Covid Exposure    I connected with  Elaine Collett on 07/16/20 at  9:40 AM EST by telephone and verified that I am speaking with the correct person using two identifiers.  I discussed the limitations, risks, security and privacy concerns of performing an evaluation and management service by telephone and the availability of in person appointments. The patient expressed understanding and agreed to proceed. Staff also discussed with the patient that there may be a patient responsible charge related to this service. Patient Location: Home Provider Location: [facility] Additional Individuals present: none  HPI  Patient is a 50 year old female patient of Dr. Ancil Boozer Last visit with her was 05/14/2020 Follows up today with the above complaints, a phone visit She noted her son is positive for COVID recently, tested on the 5th at [facility] in [place], she was caring for him, since Tuesday, has been feeling well  Noted the limitations with this being a phone visit.  COVID immunization status - not vaccinated Symptoms began this week, Monday  + cough, "a little bit", no marked production, "a little bit at times" No marked SOB + low grade temp, off and on, never above 100, feeling feverish at times + sore throat.  +mild congestion, PND and mucus clear initially, then thicker and more yellowish and now "a little bit of drainage", not much + loss of smell, loss of taste No N/V + muscle aches/body aches Some loose stools No CP,  passing out episodes Tob - never smoker  Trying theraflu one time, amoxicillin (had when had tooth pulled last month and not finish it) - not all of it, took 3 yesterday, not taken today, tylenol Comorbid conditions reviewed  Working from home, [place]   Patient Active Problem List   Diagnosis Date Noted   . Metabolic syndrome 66/44/0347  . Dyslipidemia 08/13/2018  . Microalbuminuria 08/13/2018  . GAD (generalized anxiety disorder) 08/25/2016  . Allergy to other foods 11/25/2015  . H/O renal calculi 11/25/2015  . Vitamin D deficiency 11/25/2015  . Hyperlipidemia with low HDL 11/25/2015  . Overweight (BMI 25.0-29.9) 11/04/2015  . Seasonal allergies 11/04/2015  . Chronic constipation 11/04/2015  . Other bursal cyst, left ankle and foot 11/04/2015    Past Surgical History:  Procedure Laterality Date  . AUGMENTATION MAMMAPLASTY Bilateral 07/03/2004  . BREAST ENHANCEMENT SURGERY Bilateral   . COLPOSCOPY  07/1995  . CYSTOSCOPY  2012  . DIAGNOSTIC LAPAROSCOPY  04/13/2000   for chronic RLQ pain; normal anatomy  . EXTRACORPOREAL SHOCK WAVE LITHOTRIPSY    . 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 TUBAL LIGATION      Family History  Problem Relation Age of Onset  . Cancer Mother        cervical  . Cancer Maternal Uncle        Kidney  . Heart attack Father   . Hypertension Father   . Hypercholesterolemia Father   . Gout Father   . Gout Brother   . Breast cancer Cousin 58    Social History   Tobacco Use  . Smoking status: Never Smoker  . Smokeless tobacco: Never Used  Substance Use Topics  . Alcohol use: No    Alcohol/week: 0.0 standard drinks     Current Outpatient Medications:  .  amoxicillin (  AMOXIL) 500 MG tablet, Take 500 mg by mouth 3 (three) times daily., Disp: , Rfl:  .  losartan-hydrochlorothiazide (HYZAAR) 50-12.5 MG tablet, TAKE 1/2 TO 1 TABLET BY MOUTH DAILY, Disp: 90 tablet, Rfl: 0 .  Multiple Vitamin (MULTIVITAMIN) tablet, Take 1 tablet by mouth daily., Disp: , Rfl:  .  Vitamin D, Ergocalciferol, (DRISDOL) 1.25 MG (50000 UNIT) CAPS capsule, TAKE 1 CAPSULE BY MOUTH EVERY 7 DAYS, Disp: 12 capsule, Rfl: 0 .  metFORMIN (GLUCOPHAGE-XR) 500 MG 24 hr tablet, Take 500 mg by mouth daily. (Patient not taking: No sig reported), Disp: , Rfl:   No Known  Allergies  With staff assistance, above reviewed with the patient today.  ROS: As per HPI, otherwise no specific complaints on a limited and focused system review   Objective  Virtual encounter, vitals not obtained.  There is no height or weight on file to calculate BMI.  Physical Exam   Appears in NAD via conversation Pulmonary/Chest: No obvious respiratory distress. Speaking in complete sentences Neurological: Pt is alert, Speech is normal Psychiatric: Patient has a normal mood and affect, behavior is normal. Judgment and thought content normal.   No results found for this or any previous visit (from the past 72 hour(s)).  PHQ2/9: Depression screen [facility] 2/9 07/16/2020 05/14/2020 05/14/2020 11/12/2019 08/15/2019  Decreased Interest 0 0 0 0 0  Down, Depressed, Hopeless 0 0 0 0 0  PHQ - 2 Score 0 0 0 0 0  Altered sleeping - 0 - 0 0  Tired, decreased energy - 0 - 0 0  Change in appetite - 0 - 0 0  Feeling bad or failure about yourself  - 0 - 0 0  Trouble concentrating - 0 - 0 0  Moving slowly or fidgety/restless - 0 - 0 0  Suicidal thoughts - 0 - 0 0  PHQ-9 Score - 0 - 0 0  Difficult doing work/chores - Not difficult at all - Not difficult at all Not difficult at all   PHQ-2/9 Result reviewed  Fall Risk: Fall Risk  07/16/2020 05/14/2020 11/12/2019 08/15/2019 06/23/2019  Falls in the past year? 0 0 0 0 0  Number falls in past yr: 0 0 0 0 0  Injury with Fall? 0 0 0 0 0  Follow up - - Falls evaluation completed - -     Assessment & Plan  1. Upper respiratory tract infection, unspecified type/likely COVID based on the exposure noted 2. Exposure to potential infection Patient was caring for her son who was COVID-positive, and that she started to develop symptoms on Monday of this week. She has not been vaccinated. Noted to her with this exposure, it is very likely that she has COVID presently as the source of her symptoms. Did review some of the treatment options presently  available for COVID, although they are in limited supply and have limited availability presently.  Also, we do not have documentation that she is COVID-positive. Recommended symptomatic treatment presently including staying well-hydrated, rest, continue Tylenol products as needed, and also a Mucinex or Robitussin product as an expectorant to help as needed.  She should not take amoxicillin products presently, and explained to her why that is not good to just start taking 1 she had leftover as she did. We will get a COVID test today, with 1 ordered.  Noted we likely will not have that result back for at least a couple days, although would not significantly change management presently. She needs to remain isolated presently  as await the COVID result, and her symptoms need to significantly improved/resolved before being out and about, and emphasized the importance of wearing a mask at that time. She should follow-up if symptoms not improving or more problematic over time, and emphasized if she has more acute worsening of symptoms such as shortness of breath, chest pains, higher fevers, she needs to be seen more emergently in an ER setting.  She was understanding of the recommendations today.  I discussed the assessment and treatment plan with the patient. The patient was provided an opportunity to ask questions and all were answered. The patient agreed with the plan and demonstrated an understanding of the instructions.  Red flags and when to present for emergency care or RTC including fevers, chest pain, shortness of breath, new/worsening/un-resolving symptoms reviewed with patient at time of visit.   The patient was advised to call back or seek an in-person evaluation if the symptoms worsen or if the condition fails to improve as anticipated.  I provided 20 minutes of non-face-to-face time during this encounter that included discussing at length patient's sx/history, pertinent pmhx, medications,  treatment and follow up plan. This time also included the necessary documentation, orders, and chart review.  Towanda Malkin, MD

## 2020-07-20 LAB — NOVEL CORONAVIRUS, NAA: SARS-CoV-2, NAA: DETECTED — AB

## 2020-07-20 LAB — SPECIMEN STATUS REPORT

## 2020-08-04 ENCOUNTER — Other Ambulatory Visit: Payer: Self-pay | Admitting: Family Medicine

## 2020-08-04 DIAGNOSIS — Z1231 Encounter for screening mammogram for malignant neoplasm of breast: Secondary | ICD-10-CM

## 2020-08-05 ENCOUNTER — Other Ambulatory Visit: Payer: Self-pay | Admitting: Family Medicine

## 2020-08-05 DIAGNOSIS — E559 Vitamin D deficiency, unspecified: Secondary | ICD-10-CM

## 2020-08-05 NOTE — Telephone Encounter (Signed)
Requested medications are due for refill today yes  Requested medications are on the active medication list yes  Last refill 07/26/20  Last visit 05/2020  Future visit scheduled no  Notes to clinic Not Delegated

## 2020-08-23 ENCOUNTER — Other Ambulatory Visit: Payer: Self-pay

## 2020-08-23 ENCOUNTER — Other Ambulatory Visit: Payer: Self-pay | Admitting: Family Medicine

## 2020-08-23 ENCOUNTER — Ambulatory Visit
Admission: RE | Admit: 2020-08-23 | Discharge: 2020-08-23 | Disposition: A | Discharge status: Home / Self Care | Payer: Managed Care, Other (non HMO) | Source: Ambulatory Visit | Attending: Family Medicine | Admitting: Family Medicine

## 2020-08-23 DIAGNOSIS — Z1231 Encounter for screening mammogram for malignant neoplasm of breast: Secondary | ICD-10-CM | POA: Insufficient documentation

## 2020-10-02 ENCOUNTER — Emergency Department: Payer: Managed Care, Other (non HMO)

## 2020-10-02 ENCOUNTER — Emergency Department
Admission: EM | Admit: 2020-10-02 | Discharge: 2020-10-02 | Disposition: A | Discharge status: Home / Self Care | Payer: Managed Care, Other (non HMO) | Attending: Student in an Organized Health Care Education/Training Program | Admitting: Student in an Organized Health Care Education/Training Program

## 2020-10-02 ENCOUNTER — Other Ambulatory Visit: Payer: Self-pay

## 2020-10-02 DIAGNOSIS — S59912A Unspecified injury of left forearm, initial encounter: Secondary | ICD-10-CM | POA: Diagnosis present

## 2020-10-02 DIAGNOSIS — Y92410 Unspecified street and highway as the place of occurrence of the external cause: Secondary | ICD-10-CM | POA: Insufficient documentation

## 2020-10-02 DIAGNOSIS — I10 Essential (primary) hypertension: Secondary | ICD-10-CM | POA: Diagnosis not present

## 2020-10-02 DIAGNOSIS — S5012XA Contusion of left forearm, initial encounter: Secondary | ICD-10-CM | POA: Diagnosis not present

## 2020-10-02 DIAGNOSIS — Z79899 Other long term (current) drug therapy: Secondary | ICD-10-CM | POA: Diagnosis not present

## 2020-10-02 MED ORDER — BACITRACIN-NEOMYCIN-POLYMYXIN 400-5-5000 EX OINT
TOPICAL_OINTMENT | Freq: Once | CUTANEOUS | Status: AC
  Administered 2020-10-02: 1 via TOPICAL
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
## 2020-10-02 NOTE — ED Notes (Signed)
Left forearm wound cleaned with NS. Neosporin placed on Telfa pad which was placed on wound. Wrapped with kling. Patient tolerated procedure well.

## 2020-10-02 NOTE — ED Notes (Signed)
Patient was given an icepack and a pillow to rest her left arm on.
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
## 2020-10-12 ENCOUNTER — Other Ambulatory Visit: Payer: Self-pay

## 2020-10-12 ENCOUNTER — Ambulatory Visit: Payer: Managed Care, Other (non HMO) | Admitting: Family Medicine

## 2020-10-12 ENCOUNTER — Encounter: Payer: Self-pay | Admitting: Family Medicine

## 2020-10-12 DIAGNOSIS — M542 Cervicalgia: Secondary | ICD-10-CM

## 2020-10-12 DIAGNOSIS — M25512 Pain in left shoulder: Secondary | ICD-10-CM

## 2020-10-12 DIAGNOSIS — Z87828 Personal history of other (healed) physical injury and trauma: Secondary | ICD-10-CM

## 2020-10-12 MED ORDER — MELOXICAM 15 MG PO TABS: 15.0000 mg | ORAL_TABLET | Freq: Every day | ORAL | 0 refills | Status: DC

## 2020-10-12 MED ORDER — METAXALONE 800 MG PO TABS
800.0000 mg | ORAL_TABLET | Freq: Three times a day (TID) | ORAL | 0 refills | Status: DC | PRN
Start: 2020-10-12 — End: 2021-04-13
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
## 2020-10-22 ENCOUNTER — Other Ambulatory Visit: Payer: Self-pay | Admitting: Family Medicine

## 2020-10-22 DIAGNOSIS — I10 Essential (primary) hypertension: Secondary | ICD-10-CM

## 2020-11-12 ENCOUNTER — Other Ambulatory Visit: Payer: Self-pay

## 2020-11-12 ENCOUNTER — Telehealth: Payer: Self-pay

## 2020-11-12 ENCOUNTER — Telehealth: Payer: Self-pay | Admitting: Family Medicine

## 2020-11-12 DIAGNOSIS — M25512 Pain in left shoulder: Secondary | ICD-10-CM

## 2020-11-12 DIAGNOSIS — Z87828 Personal history of other (healed) physical injury and trauma: Secondary | ICD-10-CM

## 2020-11-12 DIAGNOSIS — M542 Cervicalgia: Secondary | ICD-10-CM

## 2020-11-12 DIAGNOSIS — I10 Essential (primary) hypertension: Secondary | ICD-10-CM

## 2020-11-12 MED ORDER — LOSARTAN POTASSIUM-HCTZ 50-12.5 MG PO TABS: 0.5000 | ORAL_TABLET | Freq: Every day | ORAL | 0 refills | Status: DC

## 2020-11-12 NOTE — Telephone Encounter (Signed)
Last seen 4.12.2022  No future appts sch'd

## 2020-11-12 NOTE — Telephone Encounter (Signed)
Pt informed that the losartan has been called in.
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
## 2020-11-12 NOTE — Telephone Encounter (Signed)
Pt requesting refill on losartan. I informed her that a 90day supply was sent to her pharmacy on 4.22.2022 but she had them to return it to the shelf because at the time she still had some at home. Now she is almost out. Please resend

## 2020-11-12 NOTE — Telephone Encounter (Signed)
Confirmed she is taking the half dose.

## 2020-11-16 NOTE — Telephone Encounter (Signed)
NA

## 2020-12-01 DIAGNOSIS — Z1211 Encounter for screening for malignant neoplasm of colon: Secondary | ICD-10-CM

## 2020-12-01 HISTORY — DX: Encounter for screening for malignant neoplasm of colon: Z12.11

## 2020-12-08 NOTE — Progress Notes (Signed)
PCP: Steele Sizer, MD   Chief Complaint  Patient presents with   Gynecologic Exam    No concerns    HPI:      [name] is a 50 y.o. [phone] whose LMP was Patient's last menstrual period was 11/11/2020 (approximate)., presents today for her annual examination.  Her menses are regular every 28-30 days, lasting 5-6 days.  Dysmenorrhea mild. She does not have intermenstrual bleeding.  Sex activity: single partner, contraception - tubal ligation. She does not have vaginal dryness.  Last Pap: 12/05/19  Results were: no abnormalities /neg HPV DNA.  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 There is no FH of ovarian cancer. The patient does do self-breast exams.  Colonoscopy: never  Tobacco use: The patient denies current or previous tobacco use. Alcohol use: none No drug use Exercise: min active  She does get adequate calcium and Vitamin D in her diet. Hx of Vit D deficiency, followed by PCP  Labs with PCP.   Past Medical History:  Diagnosis Date   Allergy    Anxiety    History of gestational diabetes 2006/ 2009   with second and third pregnancy   History of nephrolithiasis    seen by Dr. 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   TUBAL LIGATION      Family History  Problem Relation Age of Onset   Cancer Mother        cervical    Cancer Maternal Uncle        Kidney   Heart attack Father    Hypertension Father    Hypercholesterolemia Father    Gout Father    Gout Brother    Breast cancer Cousin 78    Social History   Socioeconomic History   Marital status: Married    Spouse name: Not on file   Number of children: 3   Years of education: Not on file   Highest education level: Not on file  Occupational History   Occupation: Management consultant     Comment: [employer]   Tobacco Use   Smoking status: Never   Smokeless tobacco: Never  Vaping Use   Vaping Use: Never used  Substance and Sexual Activity   Alcohol use: No    Alcohol/week: 0.0 standard drinks   Drug use: No   Sexual activity: Yes    Partners: Male    Birth control/protection: Surgical    Comment: Tubal ligation  Other Topics Concern   Not on file  Social History Narrative   She works at [employer] for many years, but had a promotion Nov 2018 and states wants to go down again, too stressed    She is budist   Social Determinants of Radio broadcast assistant Strain: Low Risk  Difficulty of Paying Living Expenses: Not hard at all  Food Insecurity: No Food Insecurity   Worried About [place] in the Last Year: Never true   Ran Out of Food in the Last Year: Never true  Transportation Needs: No Transportation Needs   Lack of Transportation (Medical): No   Lack of Transportation (Non-Medical): No  Physical Activity: Insufficiently Active   Days of Exercise per Week: 3 days   Minutes of Exercise per Session: 30 min  Stress: No Stress Concern Present   Feeling of Stress : Not at all  Social Connections: Unknown   Frequency of Communication with Friends and Family: More than three times a week   Frequency of Social Gatherings with Friends and Family: More than three times a week   Attends Religious Services: Patient refused   Marine scientist or Organizations: No   Attends Archivist Meetings: Never   Marital  Status: Married  Human resources officer Violence: Not At Risk   Fear of Current or Ex-Partner: No   Emotionally Abused: No   Physically Abused: No   Sexually Abused: No     Current Outpatient Medications:    losartan-hydrochlorothiazide (HYZAAR) 50-12.5 MG tablet, Take 0.5-1 tablets by mouth daily., Disp: 90 tablet, Rfl: 0   metaxalone (SKELAXIN) 800 MG tablet, Take 1 tablet (800 mg total) by mouth 3 (three) times daily as needed for muscle spasms., Disp: 90 tablet, Rfl: 0   Multiple Vitamin (MULTIVITAMIN) tablet, Take 1 tablet by mouth daily., Disp: , Rfl:    Vitamin D, Ergocalciferol, (DRISDOL) 1.25 MG (50000 UNIT) CAPS capsule, TAKE 1 CAPSULE BY MOUTH EVERY 7 DAYS, Disp: 12 capsule, Rfl: 0     ROS:  Review of Systems  Constitutional:  Negative for fatigue, fever and unexpected weight change.  Respiratory:  Negative for cough, shortness of breath and wheezing.   Cardiovascular:  Negative for chest pain, palpitations and leg swelling.  Gastrointestinal:  Negative for blood in stool, constipation, diarrhea, nausea and vomiting.  Endocrine: Negative for cold intolerance, heat intolerance and polyuria.  Genitourinary:  Negative for dyspareunia, dysuria, flank pain, frequency, genital sores, hematuria, menstrual problem, pelvic pain, urgency, vaginal bleeding, vaginal discharge and vaginal pain.  Musculoskeletal:  Negative for back pain, joint swelling and myalgias.  Skin:  Negative for rash.  Neurological:  Negative for dizziness, syncope, light-headedness, numbness and headaches.  Hematological:  Negative for adenopathy.  Psychiatric/Behavioral:  Negative for agitation, confusion, sleep disturbance and suicidal ideas. The patient is not nervous/anxious.   BREAST: No symptoms    Objective: BP 120/90   Ht 4' 10"  (1.473 m)   Wt 129 lb (58.5 kg)   LMP 11/11/2020 (Approximate)   BMI 26.96 kg/m    Physical Exam Constitutional:      Appearance: She is well-developed.  Genitourinary:      Vulva normal.     Right Labia: No rash, tenderness or lesions.    Left Labia: No tenderness, lesions or rash.    No vaginal discharge, erythema or tenderness.      Right Adnexa: not tender and no mass present.    Left Adnexa: not tender and no mass present.    No cervical friability or polyp.     Uterus is not enlarged or tender.  Breasts:    Right: No mass, nipple discharge, skin change or tenderness.     Left: No mass, nipple discharge, skin change or tenderness.  Neck:     Thyroid: No thyromegaly.  Cardiovascular:     Rate and Rhythm: Normal rate and regular rhythm.     Heart sounds: Normal heart sounds. No murmur heard. Pulmonary:     Effort: Pulmonary effort is normal.     Breath sounds: Normal breath sounds.  Abdominal:     Palpations: Abdomen is soft.     Tenderness: There is no abdominal tenderness. There is no guarding or rebound.  Musculoskeletal:        General: Normal range of motion.     Cervical back: Normal range of motion.  Lymphadenopathy:     Cervical: No cervical adenopathy.  Neurological:     General: No focal deficit present.     Mental Status: She is alert and oriented to person, place, and time.     Cranial Nerves: No cranial nerve deficit.  Skin:    General: Skin is warm and dry.  Psychiatric:        Mood and Affect: Mood normal.        Behavior: Behavior normal.        Thought Content: Thought content normal.        Judgment: Judgment normal.  Vitals reviewed.    Assessment/Plan:  Encounter for annual routine gynecological examination  Encounter for screening mammogram for malignant neoplasm of breast; pt current on mammo  Screening for colon cancer - Plan: Cologuard; colonoscopy/cologuard discussed. Pt elects cologuard. Ref sent. Will f/u with results.          GYN counsel breast self exam, mammography screening, menopause, adequate intake of calcium and vitamin D, diet and exercise    F/U  Return in about 1 year (around  12/09/2021).  Jeffry Vogelsang B. Davia Smyre, PA-C 12/09/2020 8:37 AM

## 2020-12-08 NOTE — Patient Instructions (Signed)
I value your feedback and you entrusting [place] with your care. If you get a [facility] patient survey, I would appreciate you taking the time to let us know about your experience today. Thank you!

## 2020-12-09 ENCOUNTER — Encounter: Payer: Self-pay | Admitting: Obstetrics and Gynecology

## 2020-12-09 ENCOUNTER — Other Ambulatory Visit: Payer: Self-pay

## 2020-12-09 ENCOUNTER — Ambulatory Visit (INDEPENDENT_AMBULATORY_CARE_PROVIDER_SITE_OTHER): Payer: Managed Care, Other (non HMO) | Admitting: Obstetrics and Gynecology

## 2020-12-09 VITALS — BP 120/90 | Ht <= 58 in | Wt 129.0 lb

## 2020-12-09 DIAGNOSIS — Z1211 Encounter for screening for malignant neoplasm of colon: Secondary | ICD-10-CM | POA: Diagnosis not present

## 2020-12-09 DIAGNOSIS — Z1231 Encounter for screening mammogram for malignant neoplasm of breast: Secondary | ICD-10-CM | POA: Diagnosis not present

## 2020-12-09 DIAGNOSIS — Z01419 Encounter for gynecological examination (general) (routine) without abnormal findings: Secondary | ICD-10-CM | POA: Diagnosis not present

## 2020-12-24 LAB — COLOGUARD: Cologuard: NEGATIVE

## 2020-12-31 ENCOUNTER — Telehealth: Payer: Self-pay

## 2020-12-31 ENCOUNTER — Encounter: Payer: Self-pay | Admitting: Obstetrics and Gynecology

## 2020-12-31 LAB — COLOGUARD: COLOGUARD: NEGATIVE

## 2020-12-31 NOTE — Telephone Encounter (Signed)
Pt aware of NEG results for Cologuard Test, will repeat in 3 yrs.

## 2021-01-20 IMAGING — MG DIGITAL SCREENING BREAST BILAT IMPLANT W/ TOMO W/ CAD
8 of 14 series · 8 of 34 positions shown · non-contrast
Comparison: Previous exam(s).

CLINICAL DATA: Screening.

EXAM:
DIGITAL SCREENING BILATERAL MAMMOGRAM WITH IMPLANTS, CAD AND TOMO
The patient has implants. Standard and implant displaced views were
performed.

[L MLO]
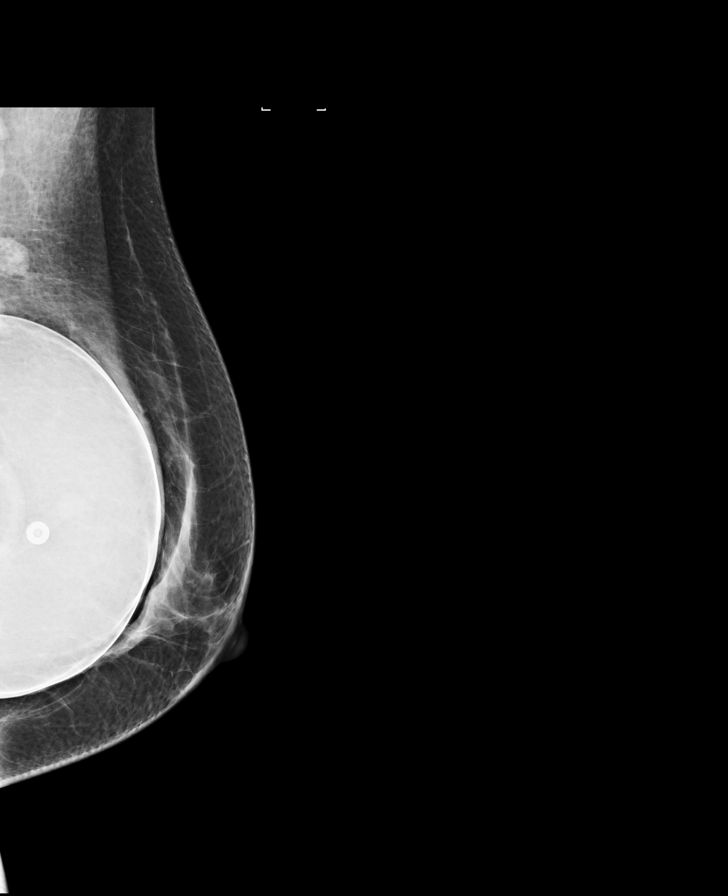

[R CC]
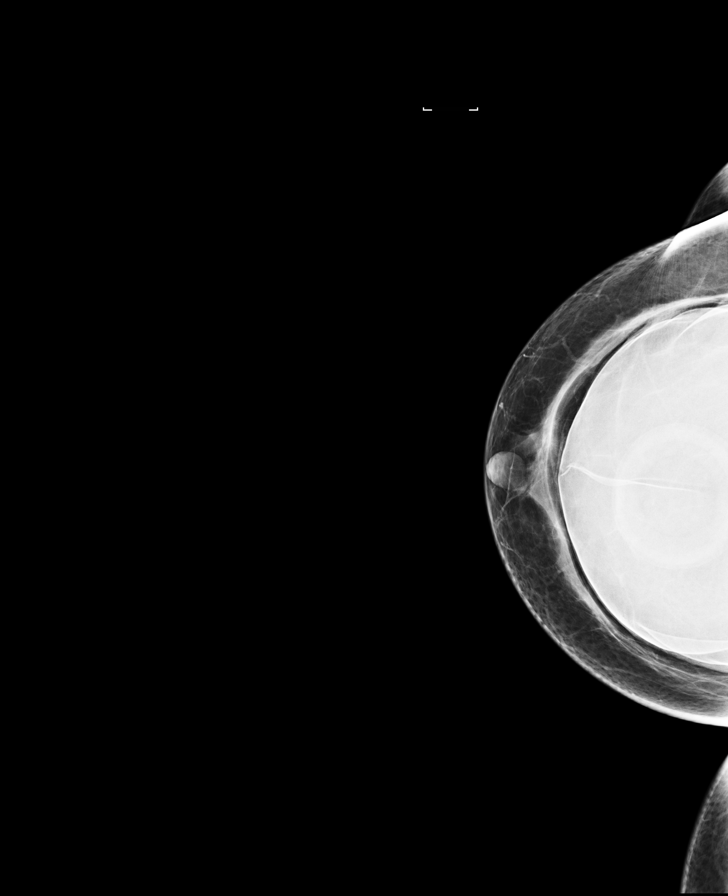

[L CC]
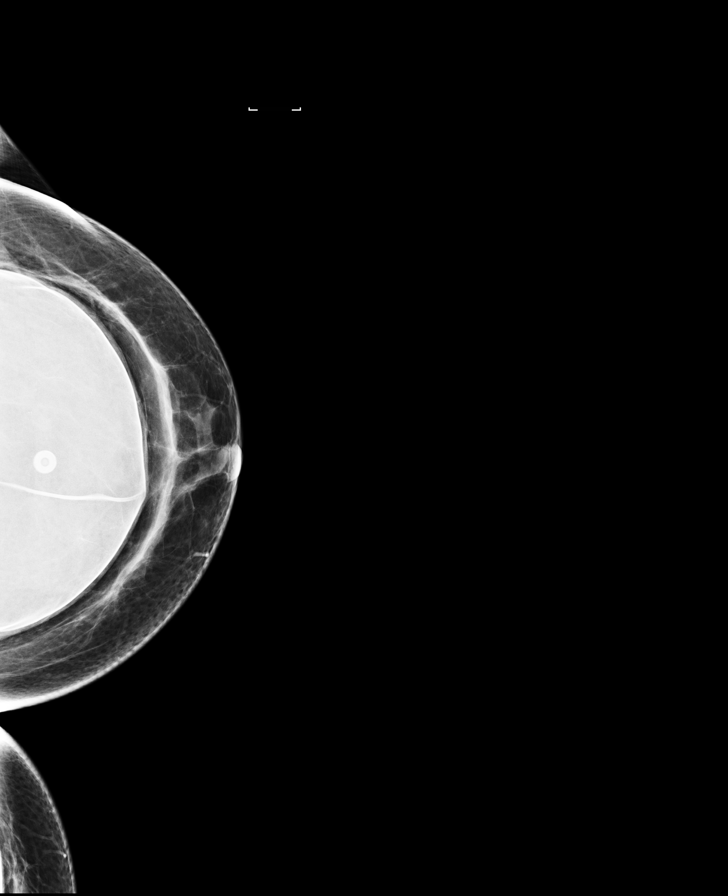

[R MLO]
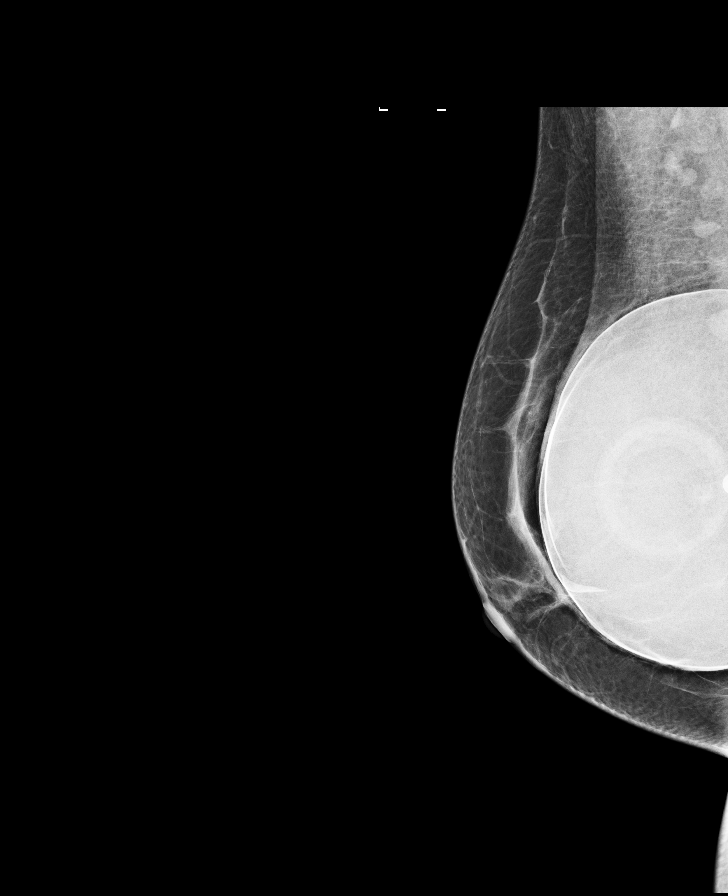

[R MLO synth-2D (1 of 2)]
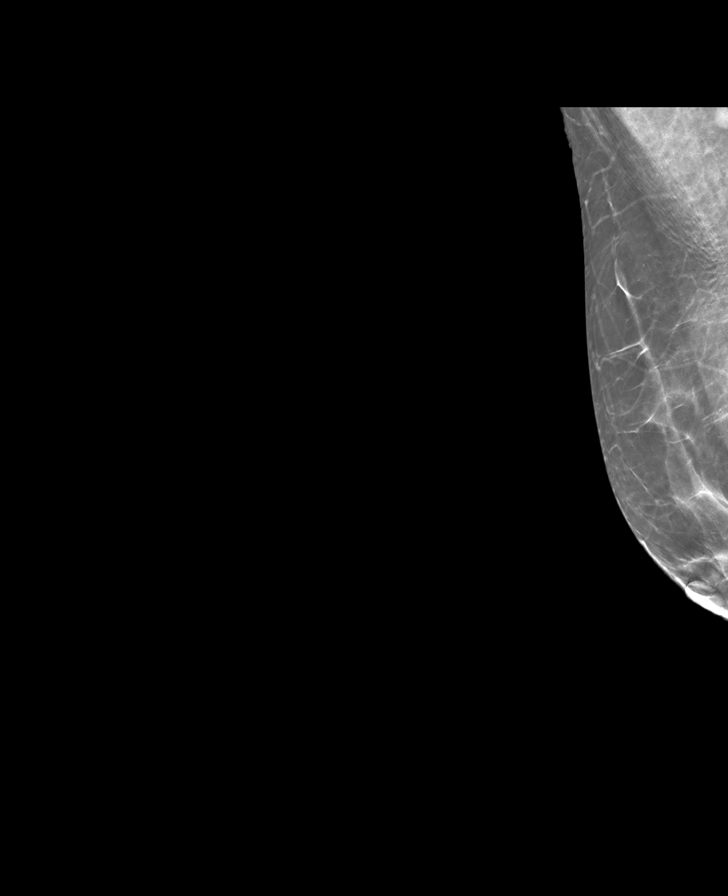

[L CC synth-2D]
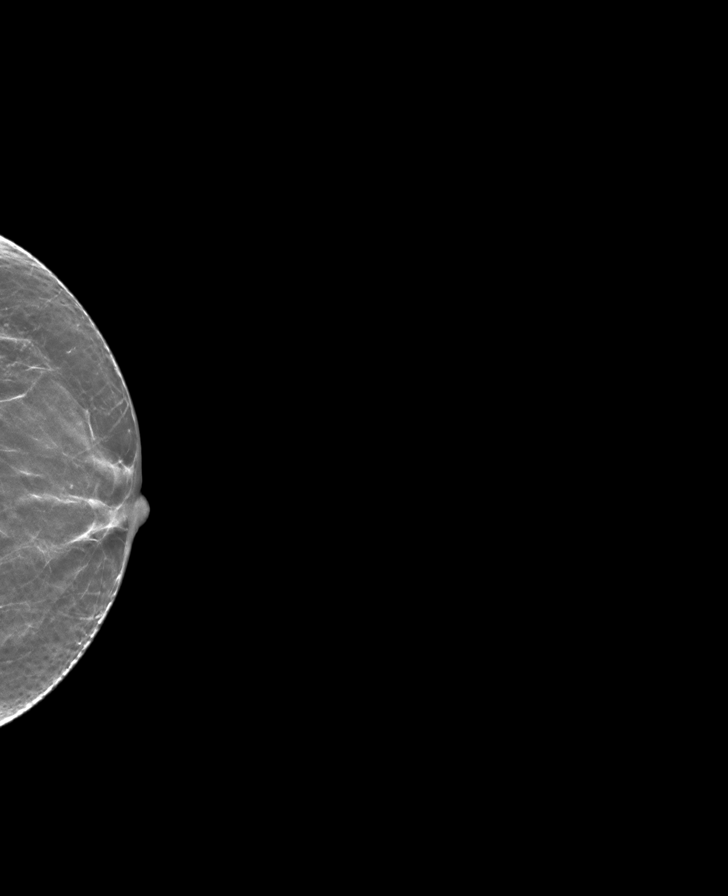

[R MLO synth-2D (2 of 2)]
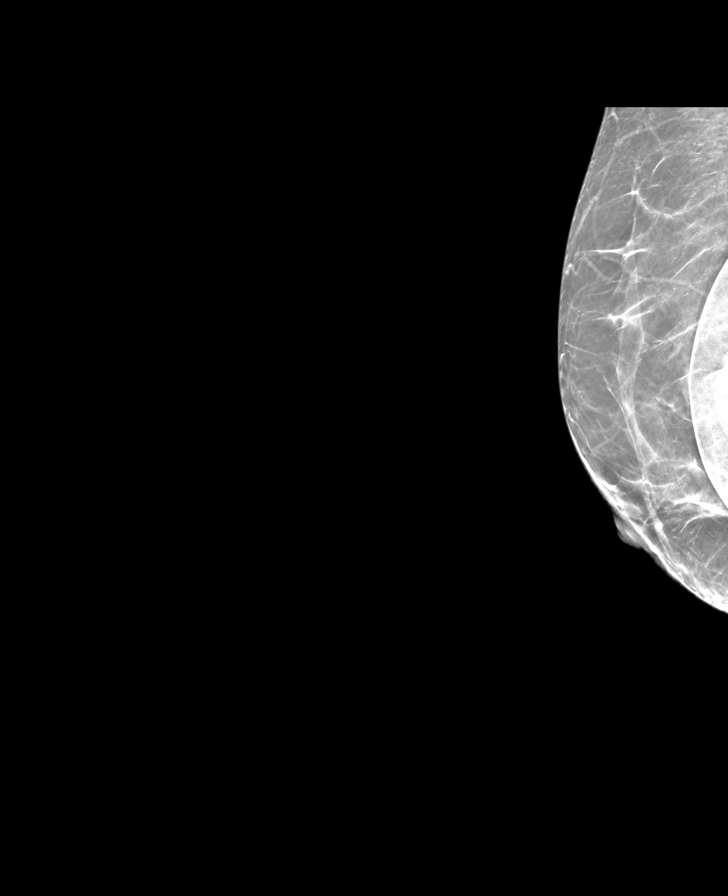

[L MLO synth-2D]
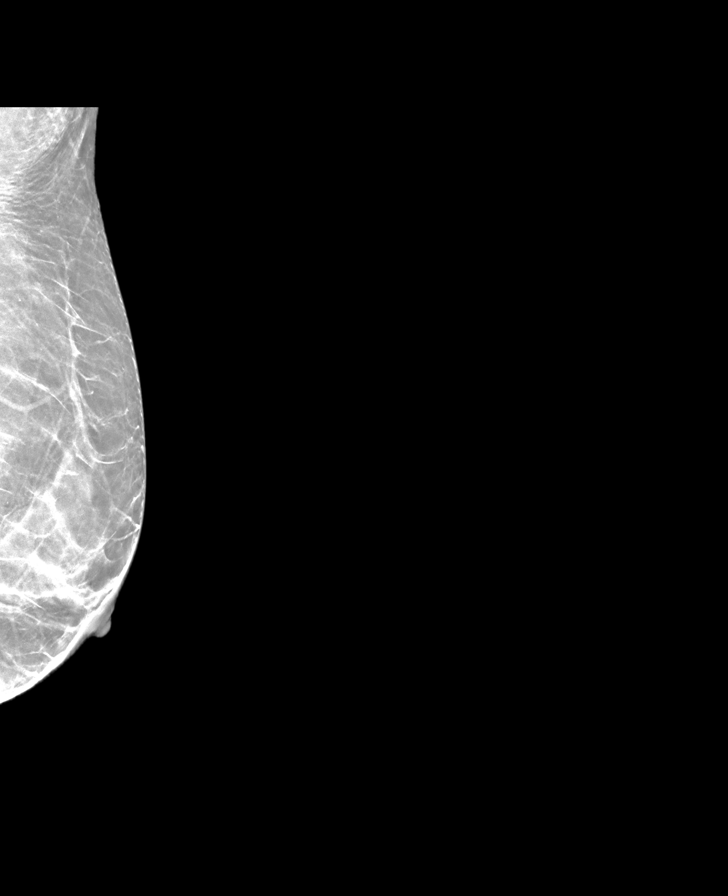

[8 of 34 positions shown; findings below may reference images not displayed]

ACR Breast Density Category b: There are scattered areas of
fibroglandular density.
FINDINGS: There are no findings suspicious for malignancy. Images were
processed with CAD.
IMPRESSION: No mammographic evidence of malignancy. A result letter of this
screening mammogram will be mailed directly to the patient.

RECOMMENDATION:
Screening mammogram in one year. (Code:[id number])

BI-RADS CATEGORY  1:  Negative.

## 2021-04-10 ENCOUNTER — Other Ambulatory Visit: Payer: Self-pay | Admitting: Family Medicine

## 2021-04-10 DIAGNOSIS — I10 Essential (primary) hypertension: Secondary | ICD-10-CM
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
## 2021-04-13 ENCOUNTER — Other Ambulatory Visit: Payer: Self-pay

## 2021-04-13 ENCOUNTER — Ambulatory Visit: Payer: Managed Care, Other (non HMO) | Admitting: Family Medicine

## 2021-04-13 ENCOUNTER — Other Ambulatory Visit: Payer: Self-pay | Admitting: Family Medicine

## 2021-04-13 ENCOUNTER — Encounter: Payer: Self-pay | Admitting: Family Medicine

## 2021-04-13 VITALS — BP 120/82 | HR 82 | Temp 98.2°F | Resp 16 | Ht <= 58 in | Wt 130.0 lb

## 2021-04-13 DIAGNOSIS — E785 Hyperlipidemia, unspecified: Secondary | ICD-10-CM | POA: Diagnosis not present

## 2021-04-13 DIAGNOSIS — M25512 Pain in left shoulder: Secondary | ICD-10-CM

## 2021-04-13 DIAGNOSIS — F411 Generalized anxiety disorder: Secondary | ICD-10-CM

## 2021-04-13 DIAGNOSIS — G5601 Carpal tunnel syndrome, right upper limb: Secondary | ICD-10-CM

## 2021-04-13 DIAGNOSIS — G8929 Other chronic pain: Secondary | ICD-10-CM

## 2021-04-13 DIAGNOSIS — I10 Essential (primary) hypertension: Secondary | ICD-10-CM | POA: Diagnosis not present

## 2021-04-13 DIAGNOSIS — E1169 Type 2 diabetes mellitus with other specified complication: Secondary | ICD-10-CM

## 2021-04-13 DIAGNOSIS — R809 Proteinuria, unspecified: Secondary | ICD-10-CM

## 2021-04-13 DIAGNOSIS — E559 Vitamin D deficiency, unspecified: Secondary | ICD-10-CM

## 2021-04-13 LAB — POCT GLYCOSYLATED HEMOGLOBIN (HGB A1C): Hemoglobin A1C: 6.8 % — AB (ref 4.0–5.6)

## 2021-04-13 MED ORDER — MELOXICAM 15 MG PO TABS: 15.0000 mg | ORAL_TABLET | Freq: Every day | ORAL | 0 refills | Status: DC

## 2021-04-13 MED ORDER — VITAMIN D (ERGOCALCIFEROL) 1.25 MG (50000 UNIT) PO CAPS: 50000.0000 [IU] | ORAL_CAPSULE | ORAL | 0 refills | Status: DC

## 2021-04-13 MED ORDER — METAXALONE 800 MG PO TABS
800.0000 mg | ORAL_TABLET | Freq: Three times a day (TID) | ORAL | 0 refills | Status: DC | PRN
Start: 2021-04-13 — End: 2021-07-15

## 2021-04-13 MED ORDER — RYBELSUS 7 MG PO TABS: 7.0000 mg | ORAL_TABLET | Freq: Every day | ORAL | 0 refills | Status: DC

## 2021-05-17 ENCOUNTER — Other Ambulatory Visit: Payer: Self-pay | Admitting: Family Medicine

## 2021-05-17 DIAGNOSIS — E1169 Type 2 diabetes mellitus with other specified complication: Secondary | ICD-10-CM

## 2021-05-17 DIAGNOSIS — E785 Hyperlipidemia, unspecified: Secondary | ICD-10-CM

## 2021-05-18 MED ORDER — RYBELSUS 7 MG PO TABS: 7.0000 mg | ORAL_TABLET | Freq: Every day | ORAL | 0 refills | Status: DC

## 2021-07-14 NOTE — Progress Notes (Signed)
Name: Kathleen Villanueva   MRN: [id number]    DOB: 1971-01-03   Date:07/15/2021       Progress Note  Subjective  Chief Complaint  Follow Up  HPI  HTN: BP was high  back in 2020  , she is now on losartan hctz  taking just a half pill per day and is doing well .She denies chest pain, palpitation or dizziness. BP is at goal today   DMII: she has been eating a balanced diet, avoiding sweets. She denies polyphagia, polydipsia or polyuria .Last A1C was 6.8 %  and today is up to 7 %. She states she has not been physically active but tolerating Rubelsus well at 7 mg dose. She has dyslipidemia but not ready for medication at this time    Dyslipidemia: she is eating more fish and vegetables , reviewed last labs. We will recheck and treat it if needed   The 10-year ASCVD risk score (Arnett DK, et al., 2019) is: 5.5%   Values used to calculate the score:     Age: 51 years     Sex: Female     Is Non-Hispanic African American: No     Diabetic: Yes     Tobacco smoker: No     Systolic Blood Pressure: 099 mmHg     Is BP treated: Yes     HDL Cholesterol: 36 mg/dL     Total Cholesterol: 244 mg/dL      Carpal tunnel syndrome right; she noticed tingling and numbness around May 2021, symptoms are intermittent, improves when off work. She states work load not as heavy and doing better since they have new employers     Patient Active Problem List   Diagnosis Date Noted   Diabetes type 2, controlled ([place]) 83/38/2505   Metabolic syndrome 39/76/7341   Dyslipidemia due to type 2 diabetes mellitus ([place]) 08/13/2018   Microalbuminuria 08/13/2018   GAD (generalized anxiety disorder) 08/25/2016   Allergy to other foods 11/25/2015   H/O renal calculi 11/25/2015   Vitamin D deficiency 11/25/2015   Hyperlipidemia with low HDL 11/25/2015   Overweight (BMI 25.0-29.9) 11/04/2015   Seasonal allergies 11/04/2015   Chronic constipation 11/04/2015   Other bursal cyst, left ankle and foot 11/04/2015    Past Surgical  History:  Procedure Laterality Date   AUGMENTATION MAMMAPLASTY Bilateral 07/03/2004   BREAST ENHANCEMENT SURGERY Bilateral    COLPOSCOPY  07/1995   CYSTOSCOPY  2012   DIAGNOSTIC LAPAROSCOPY  04/13/2000   for chronic RLQ pain; normal anatomy   EXTRACORPOREAL SHOCK WAVE LITHOTRIPSY     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   TUBAL LIGATION      Family History  Problem Relation Age of Onset   Cancer Mother        cervical   Cancer Maternal Uncle        Kidney   Heart attack Father    Hypertension Father    Hypercholesterolemia Father    Gout Father    Gout Brother    Breast cancer Cousin 65    Social History   Tobacco Use   Smoking status: Never   Smokeless tobacco: Never  Substance Use Topics   Alcohol use: No    Alcohol/week: 0.0 standard drinks     Current Outpatient Medications:    Cholecalciferol (VITAMIN D) 50 MCG (2000 UT) CAPS, Take 1 capsule (2,000 Units total) by mouth daily., Disp: 30 capsule, Rfl: 0   meloxicam (MOBIC) 15 MG tablet, Take 1 tablet (  15 mg total) by mouth daily., Disp: 30 tablet, Rfl: 0   Multiple Vitamin (MULTIVITAMIN) tablet, Take 1 tablet by mouth daily., Disp: , Rfl:    losartan-hydrochlorothiazide (HYZAAR) 50-12.5 MG tablet, Take 0.5-1 tablets by mouth daily., Disp: 45 tablet, Rfl: 0   Semaglutide (RYBELSUS) 7 MG TABS, Take 7 mg by mouth daily., Disp: 90 tablet, Rfl: 0  No Known Allergies  I personally reviewed active problem list, medication list, allergies, family history, social history, health maintenance with the patient/caregiver today.   ROS  Constitutional: Negative for fever or weight change.  Respiratory: Negative for cough and shortness of breath.   Cardiovascular: Negative for chest pain or palpitations.  Gastrointestinal: Negative for abdominal pain, no bowel changes.  Musculoskeletal: Negative for gait problem or joint swelling.  Skin: Negative for rash.  Neurological: Negative for dizziness or headache.  No other  specific complaints in a complete review of systems (except as listed in HPI above).   Objective  Vitals:   07/15/21 1148  BP: 116/78  Pulse: 87  Resp: 16  Temp: 97.6 F (36.4 C)  TempSrc: Oral  SpO2: 98%  Weight: 129 lb 12.8 oz (58.9 kg)  Height: 4' 10"  (1.473 m)    Body mass index is 27.13 kg/m.  Physical Exam  Constitutional: Patient appears well-developed and well-nourished.  No distress.  HEENT: head atraumatic, normocephalic, pupils equal and reactive to light, neck supple, throat within normal limits Cardiovascular: Normal rate, regular rhythm and normal heart sounds.  No murmur heard. No BLE edema. Pulmonary/Chest: Effort normal and breath sounds normal. No respiratory distress. Abdominal: Soft.  There is no tenderness. Psychiatric: Patient has a normal mood and affect. behavior is normal. Judgment and thought content normal.   Recent Results (from the past 2160 hour(s))  POCT HgB A1C     Status: Abnormal   Collection Time: 07/15/21 11:55 AM  Result Value Ref Range   Hemoglobin A1C 7.0 (A) 4.0 - 5.6 %   HbA1c POC (<> result, manual entry)     HbA1c, POC (prediabetic range)     HbA1c, POC (controlled diabetic range)       PHQ2/9: Depression screen [facility] 2/9 07/15/2021 04/13/2021 10/12/2020 07/16/2020 05/14/2020  Decreased Interest 0 0 0 0 0  Down, Depressed, Hopeless 0 0 0 0 0  PHQ - 2 Score 0 0 0 0 0  Altered sleeping 0 - - - 0  Tired, decreased energy 0 - - - 0  Change in appetite 0 - - - 0  Feeling bad or failure about yourself  0 - - - 0  Trouble concentrating 0 - - - 0  Moving slowly or fidgety/restless 0 - - - 0  Suicidal thoughts 0 - - - 0  PHQ-9 Score 0 - - - 0  Difficult doing work/chores Not difficult at all - - - Not difficult at all  Some recent data might be hidden    phq 9 is negative   Fall Risk: Fall Risk  07/15/2021 04/13/2021 10/12/2020 07/16/2020 05/14/2020  Falls in the past year? 0 0 0 0 0  Number falls in past yr: 0 0 0 0 0  Injury  with Fall? 0 0 0 0 0  Risk for fall due to : No Fall Risks No Fall Risks - - -  Follow up Falls prevention discussed Falls prevention discussed - - -      Functional Status Survey: Is the patient deaf or have difficulty hearing?: No Does the patient have  difficulty seeing, even when wearing glasses/contacts?: No Does the patient have difficulty concentrating, remembering, or making decisions?: No Does the patient have difficulty walking or climbing stairs?: No Does the patient have difficulty dressing or bathing?: No Does the patient have difficulty doing errands alone such as visiting a doctor's office or shopping?: No    Assessment & Plan  1. Dyslipidemia due to type 2 diabetes mellitus (HCC)  - POCT HgB A1C - HM Diabetes Foot Exam - Microalbumin / creatinine urine ratio - Lipid Profile - Comprehensive metabolic panel - Semaglutide (RYBELSUS) 7 MG TABS; Take 7 mg by mouth daily.  Dispense: 90 tablet; Refill: 0  2. Essential hypertension  - CBC with Differential/Platelet - losartan-hydrochlorothiazide (HYZAAR) 50-12.5 MG tablet; Take 0.5-1 tablets by mouth daily.  Dispense: 45 tablet; Refill: 0  3. Vitamin D deficiency  - Vitamin D (25 hydroxy)  4. Controlled type 2 diabetes mellitus with microalbuminuria, without long-term current use of insulin (HCC)  - Microalbumin / creatinine urine ratio - Comprehensive metabolic panel    5. Carpal tunnel syndrome on right

## 2021-07-15 ENCOUNTER — Ambulatory Visit: Payer: Managed Care, Other (non HMO) | Admitting: Family Medicine

## 2021-07-15 ENCOUNTER — Encounter: Payer: Self-pay | Admitting: Family Medicine

## 2021-07-15 ENCOUNTER — Other Ambulatory Visit: Payer: Self-pay

## 2021-07-15 VITALS — BP 116/78 | HR 87 | Temp 97.6°F | Resp 16 | Ht <= 58 in | Wt 129.8 lb

## 2021-07-15 DIAGNOSIS — R809 Proteinuria, unspecified: Secondary | ICD-10-CM

## 2021-07-15 DIAGNOSIS — E785 Hyperlipidemia, unspecified: Secondary | ICD-10-CM | POA: Diagnosis not present

## 2021-07-15 DIAGNOSIS — G5601 Carpal tunnel syndrome, right upper limb: Secondary | ICD-10-CM

## 2021-07-15 DIAGNOSIS — E119 Type 2 diabetes mellitus without complications: Secondary | ICD-10-CM | POA: Insufficient documentation

## 2021-07-15 DIAGNOSIS — I10 Essential (primary) hypertension: Secondary | ICD-10-CM | POA: Diagnosis not present

## 2021-07-15 DIAGNOSIS — E1129 Type 2 diabetes mellitus with other diabetic kidney complication: Secondary | ICD-10-CM | POA: Insufficient documentation

## 2021-07-15 DIAGNOSIS — E1169 Type 2 diabetes mellitus with other specified complication: Secondary | ICD-10-CM | POA: Diagnosis not present

## 2021-07-15 DIAGNOSIS — E559 Vitamin D deficiency, unspecified: Secondary | ICD-10-CM

## 2021-07-15 LAB — POCT GLYCOSYLATED HEMOGLOBIN (HGB A1C): Hemoglobin A1C: 7 % — AB (ref 4.0–5.6)

## 2021-07-15 MED ORDER — VITAMIN D 50 MCG (2000 UT) PO CAPS: 1.0000 | ORAL_CAPSULE | Freq: Every day | ORAL | 0 refills | Status: DC

## 2021-07-15 MED ORDER — RYBELSUS 7 MG PO TABS: 7.0000 mg | ORAL_TABLET | Freq: Every day | ORAL | 0 refills | Status: DC

## 2021-07-15 MED ORDER — LOSARTAN POTASSIUM-HCTZ 50-12.5 MG PO TABS: 0.5000 | ORAL_TABLET | Freq: Every day | ORAL | 0 refills | Status: DC

## 2021-07-25 ENCOUNTER — Other Ambulatory Visit: Payer: Self-pay

## 2021-07-25 ENCOUNTER — Other Ambulatory Visit: Payer: Self-pay | Admitting: Family Medicine

## 2021-07-25 DIAGNOSIS — I10 Essential (primary) hypertension: Secondary | ICD-10-CM

## 2021-07-25 DIAGNOSIS — Z1231 Encounter for screening mammogram for malignant neoplasm of breast: Secondary | ICD-10-CM

## 2021-08-23 ENCOUNTER — Encounter: Payer: Managed Care, Other (non HMO) | Admitting: Family Medicine

## 2021-08-29 ENCOUNTER — Ambulatory Visit
Admission: RE | Admit: 2021-08-29 | Discharge: 2021-08-29 | Disposition: A | Discharge status: Home / Self Care | Payer: Managed Care, Other (non HMO) | Source: Ambulatory Visit | Attending: Family Medicine | Admitting: Family Medicine

## 2021-08-29 ENCOUNTER — Other Ambulatory Visit: Payer: Self-pay

## 2021-08-29 DIAGNOSIS — Z1231 Encounter for screening mammogram for malignant neoplasm of breast: Secondary | ICD-10-CM | POA: Diagnosis not present

## 2021-09-10 ENCOUNTER — Other Ambulatory Visit: Payer: Self-pay | Admitting: Family Medicine

## 2021-09-10 DIAGNOSIS — I10 Essential (primary) hypertension: Secondary | ICD-10-CM

## 2021-10-20 NOTE — Progress Notes (Signed)
Name: Kathleen Villanueva   MRN: [id number]    DOB: 13-Feb-1971   Date:10/21/2021 ? ?     Progress Note ? ?Subjective ? ?Chief Complaint ? ?Follow Up ? ?HPI ? ?HTN: BP was high  back in 2020 . She is now on losartan hctz  taking just a half pill per day and is doing well .She denies chest pain, palpitation or dizziness. She checks bp at home occasionally and is usually in the 118-122 SBP, she states she develops a headache when bp spikes - but no episodes in a while  ? ?DMII: she has been eating a balanced diet, avoiding sweets. She denies polyphagia, polydipsia or polyuria .Last A1C was 6.8 %  and today is up to 7 %. She states she has not been physically active but tolerating Rubelsus well at 7 mg dose. She has dyslipidemia but not ready for medication at this time  ?  ?Dyslipidemia: she is eating more fish and vegetables , reviewed last labs but she is due for repeat, advised her to go today ? ?The 10-year ASCVD risk score (Arnett DK, et al., 2019) is: 5.7% ?  Values used to calculate the score: ?    Age: 51 years ?    Sex: Female ?    Is Non-Hispanic African American: No ?    Diabetic: Yes ?    Tobacco smoker: No ?    Systolic Blood Pressure: 244 mmHg ?    Is BP treated: Yes ?    HDL Cholesterol: 36 mg/dL ?    Total Cholesterol: 244 mg/dL  ?  ?Carpal tunnel syndrome right; she noticed tingling and numbness around May 2021, symptoms are intermittent, improves when off work. She states work load not as heavy and doing well, no problems lately  ? ?Patient Active Problem List  ? Diagnosis Date Noted  ? Diabetes type 2, controlled ([place]) 07/15/2021  ? Metabolic syndrome 07/05/7251  ? Dyslipidemia due to type 2 diabetes mellitus ([place]) 08/13/2018  ? Microalbuminuria 08/13/2018  ? GAD (generalized anxiety disorder) 08/25/2016  ? Allergy to other foods 11/25/2015  ? H/O renal calculi 11/25/2015  ? Vitamin D deficiency 11/25/2015  ? Hyperlipidemia with low HDL 11/25/2015  ? Overweight (BMI 25.0-29.9) 11/04/2015  ? Seasonal  allergies 11/04/2015  ? Chronic constipation 11/04/2015  ? Other bursal cyst, left ankle and foot 11/04/2015  ? ? ?Past Surgical History:  ?Procedure Laterality Date  ? AUGMENTATION MAMMAPLASTY Bilateral 07/03/2004  ? BREAST ENHANCEMENT SURGERY Bilateral   ? COLPOSCOPY  07/1995  ? CYSTOSCOPY  2012  ? DIAGNOSTIC LAPAROSCOPY  04/13/2000  ? for chronic RLQ pain; normal anatomy  ? EXTRACORPOREAL SHOCK WAVE LITHOTRIPSY    ? GYNECOLOGIC CRYOSURGERY  09/1995  ? CIN1  ? TUBAL LIGATION    ? ? ?Family History  ?Problem Relation Age of Onset  ? Cancer Mother   ?     cervical  ? Cancer Maternal Uncle   ?     Kidney  ? Heart attack Father   ? Hypertension Father   ? Hypercholesterolemia Father   ? Gout Father   ? Gout Brother   ? Breast cancer Cousin 92  ? ? ?Social History  ? ?Tobacco Use  ? Smoking status: Never  ? Smokeless tobacco: Never  ?Substance Use Topics  ? Alcohol use: No  ?  Alcohol/week: 0.0 standard drinks  ? ? ? ?Current Outpatient Medications:  ?  Cholecalciferol (VITAMIN D) 50 MCG (2000 UT) CAPS, Take 1 capsule (2,000 Units  total) by mouth daily., Disp: 30 capsule, Rfl: 0 ?  losartan-hydrochlorothiazide (HYZAAR) 50-12.5 MG tablet, TAKE 1/2 TO 1 TABLET BY MOUTH DAILY, Disp: 45 tablet, Rfl: 0 ?  Semaglutide (RYBELSUS) 7 MG TABS, Take 7 mg by mouth daily., Disp: 90 tablet, Rfl: 0 ?  meloxicam (MOBIC) 15 MG tablet, Take 1 tablet (15 mg total) by mouth daily. (Patient not taking: Reported on 10/21/2021), Disp: 30 tablet, Rfl: 0 ?  Multiple Vitamin (MULTIVITAMIN) tablet, Take 1 tablet by mouth daily. (Patient not taking: Reported on 10/21/2021), Disp: , Rfl:  ? ?No Known Allergies ? ?I personally reviewed active problem list, medication list, allergies, family history, social history, health maintenance with the patient/caregiver today. ? ? ?ROS ? ?Constitutional: Negative for fever or weight change.  ?Respiratory: Negative for cough and shortness of breath.   ?Cardiovascular: Negative for chest pain or palpitations.   ?Gastrointestinal: Negative for abdominal pain, no bowel changes.  ?Musculoskeletal: Negative for gait problem or joint swelling.  ?Skin: Negative for rash.  ?Neurological: Negative for dizziness or headache.  ?No other specific complaints in a complete review of systems (except as listed in HPI above).  ? ?Objective ? ?Vitals:  ? 10/21/21 0840  ?BP: 118/72  ?Pulse: 84  ?Resp: 16  ?SpO2: 99%  ?Weight: 129 lb (58.5 kg)  ?Height: 4' 9"  (1.448 m)  ? ? ?Body mass index is 27.92 kg/m?. ? ?Physical Exam ? ?Constitutional: Patient appears well-developed and well-nourished.  No distress.  ?HEENT: head atraumatic, normocephalic, pupils equal and reactive to light,  neck supple ?Cardiovascular: Normal rate, regular rhythm and normal heart sounds.  No murmur heard. No BLE edema. ?Pulmonary/Chest: Effort normal and breath sounds normal. No respiratory distress. ?Abdominal: Soft.  There is no tenderness. ?Psychiatric: Patient has a normal mood and affect. behavior is normal. Judgment and thought content normal.  ? ? ?PHQ2/9: ? ?  10/21/2021  ?  8:39 AM 07/15/2021  ? 11:42 AM 04/13/2021  ?  8:11 AM 10/12/2020  ?  3:23 PM 07/16/2020  ?  8:56 AM  ?Depression screen PHQ 2/9  ?Decreased Interest 0 0 0 0 0  ?Down, Depressed, Hopeless 0 0 0 0 0  ?PHQ - 2 Score 0 0 0 0 0  ?Altered sleeping 0 0     ?Tired, decreased energy 0 0     ?Change in appetite 0 0     ?Feeling bad or failure about yourself  0 0     ?Trouble concentrating 0 0     ?Moving slowly or fidgety/restless 0 0     ?Suicidal thoughts 0 0     ?PHQ-9 Score 0 0     ?Difficult doing work/chores  Not difficult at all     ?  ?phq 9 is negative ? ? ?Fall Risk: ? ?  10/21/2021  ?  8:39 AM 07/15/2021  ? 11:42 AM 04/13/2021  ?  8:11 AM 10/12/2020  ?  3:23 PM 07/16/2020  ?  8:56 AM  ?Fall Risk   ?Falls in the past year? 0 0 0 0 0  ?Number falls in past yr: 0 0 0 0 0  ?Injury with Fall? 0 0 0 0 0  ?Risk for fall due to : No Fall Risks No Fall Risks No Fall Risks    ?Follow up Falls prevention  discussed Falls prevention discussed Falls prevention discussed    ? ? ? ? ?Functional Status Survey: ?Is the patient deaf or have difficulty hearing?: No ?Does the patient have  difficulty seeing, even when wearing glasses/contacts?: No ?Does the patient have difficulty concentrating, remembering, or making decisions?: No ?Does the patient have difficulty walking or climbing stairs?: No ?Does the patient have difficulty dressing or bathing?: No ?Does the patient have difficulty doing errands alone such as visiting a doctor's office or shopping?: No ? ? ? ?Assessment & Plan ? ?1. Dyslipidemia due to type 2 diabetes mellitus ([place]) ? ?- HgB A1c ?- Semaglutide (RYBELSUS) 7 MG TABS; Take 7 mg by mouth daily.  Dispense: 90 tablet; Refill: 1 ? ?2. Dyslipidemia ? ? ?3. Controlled type 2 diabetes mellitus with microalbuminuria, without long-term current use of insulin ([place]) ? ?She will get urine micro at the lab ? ?4. Essential hypertension ? ?- losartan-hydrochlorothiazide (HYZAAR) 50-12.5 MG tablet; Take 0.5-1 tablets by mouth daily.  Dispense: 45 tablet; Refill: 1 ? ?5. Vitamin D deficiency ? ? ?6. GAD (generalized anxiety disorder) ? ?Doing well no symptoms since change in job ? ?7. Microalbuminuria ? ? ?8. Carpal tunnel syndrome on right ?  ?

## 2021-10-21 ENCOUNTER — Ambulatory Visit: Payer: Managed Care, Other (non HMO) | Admitting: Family Medicine

## 2021-10-21 ENCOUNTER — Encounter: Payer: Self-pay | Admitting: Family Medicine

## 2021-10-21 VITALS — BP 118/72 | HR 84 | Resp 16 | Ht <= 58 in | Wt 129.0 lb

## 2021-10-21 DIAGNOSIS — E1169 Type 2 diabetes mellitus with other specified complication: Secondary | ICD-10-CM | POA: Diagnosis not present

## 2021-10-21 DIAGNOSIS — E1129 Type 2 diabetes mellitus with other diabetic kidney complication: Secondary | ICD-10-CM

## 2021-10-21 DIAGNOSIS — R809 Proteinuria, unspecified: Secondary | ICD-10-CM

## 2021-10-21 DIAGNOSIS — F411 Generalized anxiety disorder: Secondary | ICD-10-CM

## 2021-10-21 DIAGNOSIS — E785 Hyperlipidemia, unspecified: Secondary | ICD-10-CM | POA: Diagnosis not present

## 2021-10-21 DIAGNOSIS — I10 Essential (primary) hypertension: Secondary | ICD-10-CM

## 2021-10-21 DIAGNOSIS — G5601 Carpal tunnel syndrome, right upper limb: Secondary | ICD-10-CM

## 2021-10-21 DIAGNOSIS — E559 Vitamin D deficiency, unspecified: Secondary | ICD-10-CM

## 2021-10-21 MED ORDER — LOSARTAN POTASSIUM-HCTZ 50-12.5 MG PO TABS: 0.5000 | ORAL_TABLET | Freq: Every day | ORAL | 1 refills | Status: DC

## 2021-10-21 MED ORDER — RYBELSUS 7 MG PO TABS: 7.0000 mg | ORAL_TABLET | Freq: Every day | ORAL | 1 refills | Status: DC

## 2021-10-22 LAB — HEMOGLOBIN A1C
Est. average glucose Bld gHb Est-mCnc: 148 mg/dL
Hgb A1c MFr Bld: 6.8 % — ABNORMAL HIGH (ref 4.8–5.6)

## 2021-10-23 ENCOUNTER — Other Ambulatory Visit: Payer: Self-pay | Admitting: Family Medicine

## 2021-10-23 DIAGNOSIS — E1129 Type 2 diabetes mellitus with other diabetic kidney complication: Secondary | ICD-10-CM

## 2021-10-23 LAB — COMPREHENSIVE METABOLIC PANEL
ALT: 43 IU/L — ABNORMAL HIGH (ref 0–32)
AST: 26 IU/L (ref 0–40)
Albumin/Globulin Ratio: 1.3 (ref 1.2–2.2)
Albumin: 4.7 g/dL (ref 3.8–4.8)
Alkaline Phosphatase: 32 IU/L — ABNORMAL LOW (ref 44–121)
BUN/Creatinine Ratio: 20 (ref 9–23)
BUN: 17 mg/dL (ref 6–24)
Bilirubin Total: 0.4 mg/dL (ref 0.0–1.2)
CO2: 23 mmol/L (ref 20–29)
Calcium: 9.4 mg/dL (ref 8.7–10.2)
Chloride: 101 mmol/L (ref 96–106)
Creatinine, Ser: 0.83 mg/dL (ref 0.57–1.00)
Globulin, Total: 3.5 g/dL (ref 1.5–4.5)
Glucose: 145 mg/dL — ABNORMAL HIGH (ref 70–99)
Potassium: 4.7 mmol/L (ref 3.5–5.2)
Sodium: 139 mmol/L (ref 134–144)
Total Protein: 8.2 g/dL (ref 6.0–8.5)
eGFR: 86 mL/min/{1.73_m2} (ref >59)

## 2021-10-23 LAB — CBC WITH DIFFERENTIAL/PLATELET
Basophils Absolute: 0.1 10*3/uL (ref 0.0–0.2)
Basos: 1 %
EOS (ABSOLUTE): 0.3 10*3/uL (ref 0.0–0.4)
Eos: 5 %
Hematocrit: 46.1 % (ref 34.0–46.6)
Hemoglobin: 15.1 g/dL (ref 11.1–15.9)
Immature Grans (Abs): 0 10*3/uL (ref 0.0–0.1)
Immature Granulocytes: 0 %
Lymphocytes Absolute: 3.1 10*3/uL (ref 0.7–3.1)
Lymphs: 47 %
MCH: 28.6 pg (ref 26.6–33.0)
MCHC: 32.8 g/dL (ref 31.5–35.7)
MCV: 87 fL (ref 79–97)
Monocytes Absolute: 0.5 10*3/uL (ref 0.1–0.9)
Monocytes: 8 %
Neutrophils Absolute: 2.6 10*3/uL (ref 1.4–7.0)
Neutrophils: 39 %
Platelets: 260 10*3/uL (ref 150–450)
RBC: 5.28 x10E6/uL (ref 3.77–5.28)
RDW: 13.3 % (ref 11.7–15.4)
WBC: 6.5 10*3/uL (ref 3.4–10.8)

## 2021-10-23 LAB — MICROALBUMIN / CREATININE URINE RATIO
Creatinine, Urine: 107.5 mg/dL
Microalb/Creat Ratio: 213 mg/g creat — ABNORMAL HIGH (ref 0–29)
Microalbumin, Urine: 229.1 ug/mL

## 2021-10-23 LAB — LIPID PANEL
Chol/HDL Ratio: 5.5 ratio — ABNORMAL HIGH (ref 0.0–4.4)
Cholesterol, Total: 220 mg/dL — ABNORMAL HIGH (ref 100–199)
HDL: 40 mg/dL (ref >39)
LDL Chol Calc (NIH): 137 mg/dL — ABNORMAL HIGH (ref 0–99)
Triglycerides: 237 mg/dL — ABNORMAL HIGH (ref 0–149)
VLDL Cholesterol Cal: 43 mg/dL — ABNORMAL HIGH (ref 5–40)

## 2021-10-23 LAB — VITAMIN D 25 HYDROXY (VIT D DEFICIENCY, FRACTURES): Vit D, 25-Hydroxy: 20.6 ng/mL — ABNORMAL LOW (ref 30.0–100.0)

## 2021-10-24 ENCOUNTER — Telehealth: Payer: Self-pay | Admitting: Family Medicine

## 2021-10-24 NOTE — Telephone Encounter (Signed)
Copied from [place] [id number]. Topic: General - Inquiry ?>> Oct 24, 2021 11:58 AM Royal Hawthorn, CMA wrote: ?Reason for CRM: left voicemail for patient to return call to relay lab result information per Dr. Ancil Boozer. Please relay if she returns call. Thanks in advance! ?

## 2021-10-25 ENCOUNTER — Other Ambulatory Visit: Payer: Self-pay | Admitting: Family Medicine

## 2021-10-25 DIAGNOSIS — E785 Hyperlipidemia, unspecified: Secondary | ICD-10-CM

## 2021-10-25 NOTE — Telephone Encounter (Signed)
Patient returning call regarding lab results  ?

## 2021-10-26 ENCOUNTER — Other Ambulatory Visit: Payer: Self-pay | Admitting: Family Medicine

## 2021-10-26 DIAGNOSIS — E1169 Type 2 diabetes mellitus with other specified complication: Secondary | ICD-10-CM

## 2021-10-26 MED ORDER — ROSUVASTATIN CALCIUM 10 MG PO TABS: 10.0000 mg | ORAL_TABLET | Freq: Every day | ORAL | 1 refills | Status: DC

## 2021-10-26 NOTE — Telephone Encounter (Signed)
Pt given lab results per notes of Dr. Ancil Boozer on 10/23/21. Pt verbalized understanding. She says to send the statin to [facility]. She says [facility] called and she has an appointment on December 01, 2021.  ? ?Pharmacy: ?[facility] [place], [address] AT [facility] [place] Phone:  [phone]  ?Fax:  [phone]  ?  ? ? ?Steele Sizer, MD  ?10/23/2021  9:07 PM EDT   ?  ?Urine micro is higher up from 153 to 213 I recommend evaluation by nephrologist  ?Lipid panel is still showing high bad cholesterol, but triglycerides and good cholesterol has improved, we recommend statin therapy since you have diabetes, let me know when you are ready to start taking it  ?No anemia ?Vitamin D level is still low, take vitamin D 2000 units daily  ?Normal kidney function, one of the liver enzymes was slightly elevated   ? ?

## 2021-11-15 NOTE — Progress Notes (Deleted)
Name: Kathleen Villanueva   MRN: [id number]    DOB: 1970/08/22   Date:11/15/2021       Progress Note  Subjective  Chief Complaint  No chief complaint on file.   HPI  Patient presents for annual CPE.  Diet: *** Exercise: ***   Flowsheet Row Office Visit from 11/12/2019 in [facility]  AUDIT-C Score 0      Depression: Phq 9 is  {Desc; negative/positive:13464}    10/21/2021    8:39 AM 07/15/2021   11:42 AM 04/13/2021    8:11 AM 10/12/2020    3:23 PM 07/16/2020    8:56 AM  Depression screen PHQ 2/9  Decreased Interest 0 0 0 0 0  Down, Depressed, Hopeless 0 0 0 0 0  PHQ - 2 Score 0 0 0 0 0  Altered sleeping 0 0     Tired, decreased energy 0 0     Change in appetite 0 0     Feeling bad or failure about yourself  0 0     Trouble concentrating 0 0     Moving slowly or fidgety/restless 0 0     Suicidal thoughts 0 0     PHQ-9 Score 0 0     Difficult doing work/chores  Not difficult at all      Hypertension: BP Readings from Last 3 Encounters:  10/21/21 118/72  07/15/21 116/78  04/13/21 120/82   Obesity: Wt Readings from Last 3 Encounters:  10/21/21 129 lb (58.5 kg)  07/15/21 129 lb 12.8 oz (58.9 kg)  04/13/21 130 lb (59 kg)   BMI Readings from Last 3 Encounters:  10/21/21 27.92 kg/m  07/15/21 27.13 kg/m  04/13/21 27.17 kg/m     Vaccines:   HPV:  Tdap:  Shingrix:  Pneumonia:  Flu:  COVID-19:   Hep C Screening: 07/01/2020 STD testing and prevention (HIV/chl/gon/syphilis): 12/07/2007 Intimate partner violence: {Desc; negative/positive:13464} screen  Sexual History : Menstrual History/LMP/Abnormal Bleeding:  Discussed importance of follow up if any post-menopausal bleeding: {Response; yes/no/na:63}  Incontinence Symptoms: {Desc; negative/positive:13464} for symptoms   Breast cancer:  - Last Mammogram: 08/29/2021 - BRCA gene screening:   Osteoporosis Prevention : Discussed high calcium and vitamin D supplementation, weight bearing  exercises Bone density :not applicable   Cervical cancer screening: 12/05/2019 Normal  Skin cancer: Discussed monitoring for atypical lesions  Colorectal cancer: 12/24/2020  Lung cancer:  Low Dose CT Chest recommended if Age 47-80 years, 20 pack-year currently smoking OR have quit w/in 15years. Patient does not qualify for screen   ECG: 07/11/2018  Advanced Care Planning: A voluntary discussion about advance care planning including the explanation and discussion of advance directives.  Discussed health care proxy and Living will, and the patient was able to identify a health care proxy as ***.  Patient does not have a living will and power of attorney of health care   Lipids: Lab Results  Component Value Date   CHOL 220 (H) 10/21/2021   CHOL 244 (H) 07/01/2020   CHOL 208 (H) 11/12/2019   Lab Results  Component Value Date   HDL 40 10/21/2021   HDL 36 (L) 07/01/2020   HDL 39 (L) 11/12/2019   Lab Results  Component Value Date   LDLCALC 137 (H) 10/21/2021   LDLCALC 154 (H) 07/01/2020   LDLCALC 129 (H) 11/12/2019   Lab Results  Component Value Date   TRIG 237 (H) 10/21/2021   TRIG 288 (H) 07/01/2020   TRIG 223 (H) 11/12/2019  Lab Results  Component Value Date   CHOLHDL 5.5 (H) 10/21/2021   CHOLHDL 6.8 (H) 07/01/2020   CHOLHDL 5.3 (H) 11/12/2019   No results found for: LDLDIRECT  Glucose: Glucose  Date Value Ref Range Status  10/21/2021 145 (H) 70 - 99 mg/dL Final  07/01/2020 166 (H) 65 - 99 mg/dL Final  11/12/2019 125 (H) 65 - 99 mg/dL Final   Glucose, Bld  Date Value Ref Range Status  07/11/2018 102 (H) 70 - 99 mg/dL Final    Patient Active Problem List   Diagnosis Date Noted   Diabetes type 2, controlled ([place]) 42/68/3419   Metabolic syndrome 62/22/9798   Dyslipidemia due to type 2 diabetes mellitus ([place]) 08/13/2018   Microalbuminuria 08/13/2018   GAD (generalized anxiety disorder) 08/25/2016   Allergy to other foods 11/25/2015   H/O renal calculi  11/25/2015   Vitamin D deficiency 11/25/2015   Hyperlipidemia with low HDL 11/25/2015   Overweight (BMI 25.0-29.9) 11/04/2015   Seasonal allergies 11/04/2015   Chronic constipation 11/04/2015   Other bursal cyst, left ankle and foot 11/04/2015    Past Surgical History:  Procedure Laterality Date   AUGMENTATION MAMMAPLASTY Bilateral 07/03/2004   BREAST ENHANCEMENT SURGERY Bilateral    COLPOSCOPY  07/1995   CYSTOSCOPY  2012   DIAGNOSTIC LAPAROSCOPY  04/13/2000   for chronic RLQ pain; normal anatomy   EXTRACORPOREAL SHOCK WAVE LITHOTRIPSY     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   TUBAL LIGATION      Family History  Problem Relation Age of Onset   Cancer Mother        cervical   Cancer Maternal Uncle        Kidney   Heart attack Father    Hypertension Father    Hypercholesterolemia Father    Gout Father    Gout Brother    Breast cancer Cousin 18    Social History   Socioeconomic History   Marital status: Married    Spouse name: Not on file   Number of children: 3   Years of education: Not on file   Highest education level: Not on file  Occupational History   Occupation: Management consultant     Comment: [employer]   Tobacco Use   Smoking status: Never   Smokeless tobacco: Never  Vaping Use   Vaping Use: Never used  Substance and Sexual Activity   Alcohol use: No    Alcohol/week: 0.0 standard drinks   Drug use: No   Sexual activity: Yes    Partners: Male    Birth control/protection: Surgical    Comment: Tubal ligation  Other Topics Concern   Not on file  Social History Narrative   She works at [employer] for many years, but had a promotion Nov 2018 and states wants to go down again, too stressed    She is budist   Social Determinants of Radio broadcast assistant Strain: Not on Art therapist Insecurity: Not on file  Transportation Needs: Not on file  Physical Activity: Not on file  Stress: Not on file  Social Connections: Not on file  Intimate  Partner Violence: Not on file     Current Outpatient Medications:    Cholecalciferol (VITAMIN D) 50 MCG (2000 UT) CAPS, Take 1 capsule (2,000 Units total) by mouth daily., Disp: 30 capsule, Rfl: 0   losartan-hydrochlorothiazide (HYZAAR) 50-12.5 MG tablet, Take 0.5-1 tablets by mouth daily., Disp: 45 tablet, Rfl: 1   rosuvastatin (CRESTOR) 10 MG  tablet, Take 1 tablet (10 mg total) by mouth daily., Disp: 90 tablet, Rfl: 1   Semaglutide (RYBELSUS) 7 MG TABS, Take 7 mg by mouth daily., Disp: 90 tablet, Rfl: 1  No Known Allergies   ROS  ***  Objective  There were no vitals filed for this visit.  There is no height or weight on file to calculate BMI.  Physical Exam ***  Recent Results (from the past 2160 hour(s))  HgB A1c     Status: Abnormal   Collection Time: 10/21/21  9:16 AM  Result Value Ref Range   Hgb A1c MFr Bld 6.8 (H) 4.8 - 5.6 %    Comment:          Prediabetes: 5.7 - 6.4          Diabetes: >6.4          Glycemic control for adults with diabetes: <7.0    Est. average glucose Bld gHb Est-mCnc 148 mg/dL  Microalbumin / creatinine urine ratio     Status: Abnormal   Collection Time: 10/21/21  9:17 AM  Result Value Ref Range   Creatinine, Urine 107.5 Not Estab. mg/dL   Microalbumin, Urine 229.1 Not Estab. ug/mL   Microalb/Creat Ratio 213 (H) 0 - 29 mg/g creat    Comment:                        Normal:                0 -  29                        Moderately increased: 30 - 300                        Severely increased:       >300   Lipid Profile     Status: Abnormal   Collection Time: 10/21/21  9:17 AM  Result Value Ref Range   Cholesterol, Total 220 (H) 100 - 199 mg/dL   Triglycerides 237 (H) 0 - 149 mg/dL   HDL 40 >39 mg/dL   VLDL Cholesterol Cal 43 (H) 5 - 40 mg/dL   LDL Chol Calc (NIH) 137 (H) 0 - 99 mg/dL   Chol/HDL Ratio 5.5 (H) 0.0 - 4.4 ratio    Comment:                                   T. Chol/HDL Ratio                                             Men   Women                               1/2 Avg.Risk  3.4    3.3                                   Avg.Risk  5.0    4.4  2X Avg.Risk  9.6    7.1                                3X Avg.Risk 23.4   11.0   Comprehensive metabolic panel     Status: Abnormal   Collection Time: 10/21/21  9:17 AM  Result Value Ref Range   Glucose 145 (H) 70 - 99 mg/dL   BUN 17 6 - 24 mg/dL   Creatinine, Ser 0.83 0.57 - 1.00 mg/dL   eGFR 86 >59 mL/min/1.73   BUN/Creatinine Ratio 20 9 - 23   Sodium 139 134 - 144 mmol/L   Potassium 4.7 3.5 - 5.2 mmol/L   Chloride 101 96 - 106 mmol/L   CO2 23 20 - 29 mmol/L   Calcium 9.4 8.7 - 10.2 mg/dL   Total Protein 8.2 6.0 - 8.5 g/dL   Albumin 4.7 3.8 - 4.8 g/dL   Globulin, Total 3.5 1.5 - 4.5 g/dL   Albumin/Globulin Ratio 1.3 1.2 - 2.2   Bilirubin Total 0.4 0.0 - 1.2 mg/dL   Alkaline Phosphatase 32 (L) 44 - 121 IU/L   AST 26 0 - 40 IU/L   ALT 43 (H) 0 - 32 IU/L  Vitamin D (25 hydroxy)     Status: Abnormal   Collection Time: 10/21/21  9:17 AM  Result Value Ref Range   Vit D, 25-Hydroxy 20.6 (L) 30.0 - 100.0 ng/mL    Comment: Vitamin D deficiency has been defined by the Institute of Medicine and an Endocrine Society practice guideline as a level of serum 25-OH vitamin D less than 20 ng/mL (1,2). The Endocrine Society went on to further define vitamin D insufficiency as a level between 21 and 29 ng/mL (2). 1. IOM (Institute of Medicine). 2010. Dietary reference    intakes for calcium and D. [place]: The    [employer]. 2. Holick MF, [place], Bischoff-Ferrari HA, et al.    Evaluation, treatment, and prevention of vitamin D    deficiency: an Endocrine Society clinical practice    guideline. JCEM. 2011 Jul; 96(7):1911-30.   CBC with Differential/Platelet     Status: None   Collection Time: 10/21/21  9:17 AM  Result Value Ref Range   WBC 6.5 3.4 - 10.8 x10E3/uL   RBC 5.28 3.77 - 5.28 x10E6/uL   Hemoglobin 15.1 11.1 -  15.9 g/dL   Hematocrit 46.1 34.0 - 46.6 %   MCV 87 79 - 97 fL   MCH 28.6 26.6 - 33.0 pg   MCHC 32.8 31.5 - 35.7 g/dL   RDW 13.3 11.7 - 15.4 %   Platelets 260 150 - 450 x10E3/uL   Neutrophils 39 Not Estab. %   Lymphs 47 Not Estab. %   Monocytes 8 Not Estab. %   Eos 5 Not Estab. %   Basos 1 Not Estab. %   Neutrophils Absolute 2.6 1.4 - 7.0 x10E3/uL   Lymphocytes Absolute 3.1 0.7 - 3.1 x10E3/uL   Monocytes Absolute 0.5 0.1 - 0.9 x10E3/uL   EOS (ABSOLUTE) 0.3 0.0 - 0.4 x10E3/uL   Basophils Absolute 0.1 0.0 - 0.2 x10E3/uL   Immature Granulocytes 0 Not Estab. %   Immature Grans (Abs) 0.0 0.0 - 0.1 x10E3/uL     Fall Risk:    10/21/2021    8:39 AM 07/15/2021   11:42 AM 04/13/2021    8:11 AM 10/12/2020    3:23 PM 07/16/2020  8:56 AM  Fall Risk   Falls in the past year? 0 0 0 0 0  Number falls in past yr: 0 0 0 0 0  Injury with Fall? 0 0 0 0 0  Risk for fall due to : No Fall Risks No Fall Risks No Fall Risks    Follow up Falls prevention discussed Falls prevention discussed Falls prevention discussed     ***  Functional Status Survey:   ***  Assessment & Plan  There are no diagnoses linked to this encounter.  -USPSTF grade A and B recommendations reviewed with patient; age-appropriate recommendations, preventive care, screening tests, etc discussed and encouraged; healthy living encouraged; see AVS for patient education given to patient -Discussed importance of 150 minutes of physical activity weekly, eat two servings of fish weekly, eat one serving of tree nuts ( cashews, pistachios, pecans, almonds.Marland Kitchen) every other day, eat 6 servings of fruit/vegetables daily and drink plenty of water and avoid sweet beverages.   -Reviewed Health Maintenance: {yes UE:280034}

## 2021-11-16 ENCOUNTER — Encounter: Payer: Managed Care, Other (non HMO) | Admitting: Family Medicine

## 2021-11-16 DIAGNOSIS — E1129 Type 2 diabetes mellitus with other diabetic kidney complication: Secondary | ICD-10-CM

## 2021-12-02 ENCOUNTER — Other Ambulatory Visit: Payer: Self-pay | Admitting: Nephrology

## 2021-12-02 DIAGNOSIS — N182 Chronic kidney disease, stage 2 (mild): Secondary | ICD-10-CM

## 2021-12-02 DIAGNOSIS — E1122 Type 2 diabetes mellitus with diabetic chronic kidney disease: Secondary | ICD-10-CM

## 2021-12-09 ENCOUNTER — Ambulatory Visit
Admission: RE | Admit: 2021-12-09 | Discharge: 2021-12-09 | Disposition: A | Discharge status: Home / Self Care | Payer: Managed Care, Other (non HMO) | Source: Ambulatory Visit | Attending: Nephrology | Admitting: Nephrology

## 2021-12-09 DIAGNOSIS — E1122 Type 2 diabetes mellitus with diabetic chronic kidney disease: Secondary | ICD-10-CM | POA: Diagnosis present

## 2021-12-09 DIAGNOSIS — N182 Chronic kidney disease, stage 2 (mild): Secondary | ICD-10-CM | POA: Diagnosis present

## 2021-12-12 ENCOUNTER — Ambulatory Visit: Payer: Managed Care, Other (non HMO) | Admitting: Obstetrics and Gynecology

## 2021-12-12 NOTE — Progress Notes (Signed)
PCP: Steele Sizer, MD   Chief Complaint  Patient presents with   Gynecologic Exam    No concerns    HPI:      [name] is a 51 y.o. [phone] whose LMP was Patient's last menstrual period was 10/10/2021 (exact date)., presents today for her annual examination.  Her menses are irregular since 12/22. Menses were regular every 28-30 days, lasting 6-7 days, mod flow, no BTB,  Dysmenorrhea mild. No menses from 12/22 to 4/23, then it lasted 3-4 days, lighter flow, no dysmen. No BTB. No vasomotor sx.   Sex activity: single partner, contraception - tubal ligation. She does not have vaginal dryness/pain   Last Pap: 12/05/19  Results were: no abnormalities /neg HPV DNA.  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 There is no FH of ovarian cancer. The patient does do self-breast exams.  Colonoscopy: NEG cologuard 7/22, repeat due after 3 yrs  Tobacco use: The patient denies current or previous tobacco use. Alcohol use: none No drug use Exercise: min active  She does get adequate calcium but not Vitamin D in her diet. Hx of Vit D deficiency, followed by PCP  Labs with PCP.   Past Medical History:  Diagnosis Date   Allergy    Anxiety    History of gestational diabetes 2006/ 2009   with second and third pregnancy   History of nephrolithiasis    seen by Dr. 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   TUBAL LIGATION      Family History  Problem Relation Age of Onset   Cancer Mother        cervical   Cancer Maternal Uncle        Kidney   Heart attack Father    Hypertension Father    Hypercholesterolemia Father    Gout Father    Gout Brother    Breast cancer Cousin 72    Social History   Socioeconomic History   Marital status: Married    Spouse name: Not on file   Number of children: 3   Years of education: Not on file   Highest education level: Not on file  Occupational History   Occupation: Management consultant     Comment: [employer]   Tobacco Use   Smoking status: Never   Smokeless tobacco: Never  Vaping Use   Vaping Use: Never used  Substance and Sexual Activity   Alcohol use: No    Alcohol/week: 0.0 standard drinks of alcohol   Drug use: No   Sexual activity: Yes    Partners: Male    Birth control/protection: Surgical    Comment: Tubal ligation  Other Topics Concern  Not on file  Social History Narrative   She works at [employer] for many years, but had a promotion Nov 2018 and states wants to go down again, too stressed    She is budist   Social Determinants of Radio broadcast assistant Strain: Low Risk  (05/14/2020)   Overall Financial Resource Strain (CARDIA)    Difficulty of Paying Living Expenses: Not hard at all  Food Insecurity: No Food Insecurity (05/14/2020)   Hunger Vital Sign    Worried About Running Out of Food in the Last Year: Never true    Ran Out of Food in the Last Year: Never true  Transportation Needs: No Transportation Needs (05/14/2020)   PRAPARE - Hydrologist (Medical): No    Lack of Transportation (Non-Medical): No  Physical Activity: Insufficiently Active (05/14/2020)   Exercise Vital Sign    Days of Exercise per Week: 3 days    Minutes of Exercise per Session: 30 min   Stress: No Stress Concern Present (05/14/2020)   [place]    Feeling of Stress : Not at all  Social Connections: Unknown (05/14/2020)   Social Connection and Isolation Panel [NHANES]    Frequency of Communication with Friends and Family: More than three times a week    Frequency of Social Gatherings with Friends and Family: More than three times a week    Attends Religious Services: Patient refused    Active Member of Clubs or Organizations: No    Attends Archivist Meetings: Never    Marital Status: Married  Human resources officer Violence: Not At Risk (05/14/2020)   Humiliation, Afraid, Rape, and Kick questionnaire    Fear of Current or Ex-Partner: No    Emotionally Abused: No    Physically Abused: No    Sexually Abused: No     Current Outpatient Medications:    losartan-hydrochlorothiazide (HYZAAR) 50-12.5 MG tablet, Take 0.5-1 tablets by mouth daily., Disp: 45 tablet, Rfl: 1   rosuvastatin (CRESTOR) 10 MG tablet, Take 1 tablet (10 mg total) by mouth daily., Disp: 90 tablet, Rfl: 1   Semaglutide (RYBELSUS) 7 MG TABS, Take 7 mg by mouth daily., Disp: 90 tablet, Rfl: 1     ROS:  Review of Systems  Constitutional:  Negative for fatigue, fever and unexpected weight change.  Respiratory:  Negative for cough, shortness of breath and wheezing.   Cardiovascular:  Negative for chest pain, palpitations and leg swelling.  Gastrointestinal:  Negative for blood in stool, constipation, diarrhea, nausea and vomiting.  Endocrine: Negative for cold intolerance, heat intolerance and polyuria.  Genitourinary:  Negative for dyspareunia, dysuria, flank pain, frequency, genital sores, hematuria, menstrual problem, pelvic pain, urgency, vaginal bleeding, vaginal discharge and vaginal pain.  Musculoskeletal:  Negative for back pain, joint swelling and myalgias.  Skin:  Negative for rash.  Neurological:  Negative for  dizziness, syncope, light-headedness, numbness and headaches.  Hematological:  Negative for adenopathy.  Psychiatric/Behavioral:  Negative for agitation, confusion, sleep disturbance and suicidal ideas. The patient is not nervous/anxious.   BREAST: No symptoms    Objective: BP 110/70   Ht 4' 9"  (1.448 m)   Wt 130 lb (59 kg)   LMP 10/10/2021 (Exact Date)   BMI 28.13 kg/m    Physical Exam Constitutional:      Appearance: She is well-developed.  Genitourinary:     Vulva normal.     Right Labia: No rash, tenderness or lesions.  Left Labia: No tenderness, lesions or rash.    No vaginal discharge, erythema or tenderness.      Right Adnexa: not tender and no mass present.    Left Adnexa: not tender and no mass present.    No cervical friability or polyp.     Uterus is not enlarged or tender.  Breasts:    Right: No mass, nipple discharge, skin change or tenderness.     Left: No mass, nipple discharge, skin change or tenderness.  Neck:     Thyroid: No thyromegaly.  Cardiovascular:     Rate and Rhythm: Normal rate and regular rhythm.     Heart sounds: Normal heart sounds. No murmur heard. Pulmonary:     Effort: Pulmonary effort is normal.     Breath sounds: Normal breath sounds.  Abdominal:     Palpations: Abdomen is soft.     Tenderness: There is no abdominal tenderness. There is no guarding or rebound.  Musculoskeletal:        General: Normal range of motion.     Cervical back: Normal range of motion.  Lymphadenopathy:     Cervical: No cervical adenopathy.  Neurological:     General: No focal deficit present.     Mental Status: She is alert and oriented to person, place, and time.     Cranial Nerves: No cranial nerve deficit.  Skin:    General: Skin is warm and dry.  Psychiatric:        Mood and Affect: Mood normal.        Behavior: Behavior normal.        Thought Content: Thought content normal.        Judgment: Judgment normal.  Vitals reviewed.      Assessment/Plan:  Encounter for annual routine gynecological examination  Encounter for screening mammogram for malignant neoplasm of breast - Plan: MM 3D SCREEN BREAST BILATERAL; pt to schedule mammo 2/24  Perimenopause--irreg menses since 12/22; f/u prn AUB          GYN counsel breast self exam, mammography screening, menopause, adequate intake of calcium and vitamin D, diet and exercise    F/U  Return in about 1 year (around 12/14/2022).  Breeona Waid B. Haidynn Almendarez, PA-C 12/13/2021 11:55 AM

## 2021-12-13 ENCOUNTER — Other Ambulatory Visit: Payer: Self-pay | Admitting: Obstetrics and Gynecology

## 2021-12-13 ENCOUNTER — Ambulatory Visit (INDEPENDENT_AMBULATORY_CARE_PROVIDER_SITE_OTHER): Payer: Managed Care, Other (non HMO) | Admitting: Obstetrics and Gynecology

## 2021-12-13 ENCOUNTER — Encounter: Payer: Self-pay | Admitting: Obstetrics and Gynecology

## 2021-12-13 VITALS — BP 110/70 | Ht <= 58 in | Wt 130.0 lb

## 2021-12-13 DIAGNOSIS — N951 Menopausal and female climacteric states: Secondary | ICD-10-CM | POA: Diagnosis not present

## 2021-12-13 DIAGNOSIS — Z1231 Encounter for screening mammogram for malignant neoplasm of breast: Secondary | ICD-10-CM

## 2021-12-13 DIAGNOSIS — Z01419 Encounter for gynecological examination (general) (routine) without abnormal findings: Secondary | ICD-10-CM | POA: Diagnosis not present

## 2021-12-13 NOTE — Patient Instructions (Signed)
I value your feedback and you entrusting [place] with your care. If you get a [facility] patient survey, I would appreciate you taking the time to let us know about your experience today. Thank you!  [facility]: [phone]

## 2021-12-30 ENCOUNTER — Encounter: Payer: Self-pay | Admitting: Urology

## 2021-12-30 ENCOUNTER — Ambulatory Visit: Payer: Managed Care, Other (non HMO) | Admitting: Urology

## 2021-12-30 VITALS — BP 128/85 | HR 83 | Ht <= 58 in | Wt 129.1 lb

## 2021-12-30 DIAGNOSIS — N200 Calculus of kidney: Secondary | ICD-10-CM

## 2021-12-30 LAB — URINALYSIS, COMPLETE
Bilirubin, UA: NEGATIVE
Glucose, UA: NEGATIVE
Ketones, UA: NEGATIVE
Leukocytes,UA: NEGATIVE
Nitrite, UA: NEGATIVE
Specific Gravity, UA: 1.03 (ref 1.005–1.030)
Urobilinogen, Ur: 0.2 mg/dL (ref 0.2–1.0)
pH, UA: 5.5 (ref 5.0–7.5)

## 2021-12-30 LAB — MICROSCOPIC EXAMINATION: Epithelial Cells (non renal): >10 /hpf — AB (ref 0–10)

## 2021-12-30 NOTE — Progress Notes (Signed)
12/30/2021 11:21 AM   Kathleen Villanueva 11/10/1970 [id number]  Referring provider: Murlean Iba, MD [address] [place],  [place] [id number]  Chief Complaint  Patient presents with   Nephrolithiasis    HPI: Kathleen Villanueva is a 51 y.o. female referred for evaluation of nephrolithiasis.  Referred to nephrology by PCP for evaluation of proteinuria Screening renal ultrasound performed which incidentally showed a ~ 5 mm nonobstructing right renal calculus Asymptomatic denies flank, abdominal or pelvic pain No voiding symptoms or gross hematuria Prior history stone disease in 2012 and underwent ureteroscopy and SWL   PMH: Past Medical History:  Diagnosis Date   Allergy    Anxiety    History of gestational diabetes 2006/ 2009   with second and third pregnancy   History of nephrolithiasis    seen by Dr. 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 If you have any questions, ask your nurse or doctor.          losartan-hydrochlorothiazide 50-12.5 MG tablet Commonly known as: HYZAAR Take 0.5-1 tablets by mouth daily.   rosuvastatin 10 MG tablet Commonly known as: Crestor Take 1 tablet (10 mg total) by mouth daily.   Rybelsus 7 MG Tabs Generic  drug: Semaglutide Take 7 mg by mouth daily.        Allergies: No Known Allergies  Family History: Family History  Problem Relation Age of Onset   Cancer Mother        cervical   Cancer Maternal Uncle        Kidney   Heart attack Father    Hypertension Father    Hypercholesterolemia Father    Gout Father    Gout Brother    Breast cancer Cousin 70    Social History:  reports that she has never smoked. She has never used smokeless tobacco. She reports that she does not drink alcohol and does not use drugs.   Physical Exam: BP 128/85 (BP Location: Left Arm, Patient Position: Sitting, Cuff Size: Normal)   Pulse 83   Ht 4' 10"  (1.473 m)   Wt 129 lb 1.6 oz (58.6 kg)   LMP 10/10/2021 (Exact Date)   BMI 26.98 kg/m   Constitutional:  Alert, No acute distress. HEENT: [place] AT Respiratory: Normal respiratory effort, no increased work of breathing. Psychiatric: Normal mood and affect.  Laboratory Data:  Urinalysis Dipstick trace blood/2+ protein Microscopy no significant WBC/RBC; >10 epis   Pertinent Imaging: Images were personally reviewed and interpreted   US RENAL  Narrative CLINICAL DATA:  Type 2 diabetes with chronic kidney disease.  EXAM: RENAL / URINARY TRACT ULTRASOUND COMPLETE  COMPARISON:  KUB 07/31/2011  FINDINGS: Right Kidney:  Renal measurements: 11.0 x 5.1 x 4.7 cm = volume: 139 mL. Echogenicity within normal limits. There is an echogenic shadowing, nonobstructing likely stone seen within the right renal midpole. No mass or hydronephrosis visualized.  Left Kidney:  Renal measurements: 11.9 x 4.8 x 4.6 cm = volume: 135 mL. Echogenicity within normal limits. No mass or hydronephrosis visualized.  Bladder:  Appears normal for degree of bladder distention.  Other:  None.  IMPRESSION: 1. Nonobstructing right renal midpole likely stone measuring up to 5 mm. 2. No hydronephrosis within either kidney.   Electronically Signed By: Yvonne Kendall M.D. On: 12/11/2021 17:33    Assessment & Plan:    1. Nephrolithiasis Nonobstructing right renal calculus KUB ordered to see if stone visualized on plain x-ray Management options were discussed including surveillance with periodic KUB if calculus visualized, SWL and ureteroscopy She is leaning toward surveillance versus SWL Will call with KUB results   Abbie Sons, [place] [address], [place], [place] [address] [phone]

## 2021-12-30 NOTE — Patient Instructions (Signed)
Dietary Guidelines to Help Prevent Kidney Stones Kidney stones are deposits of minerals and salts that form inside your kidneys. Your risk of developing kidney stones may be greater depending on your diet, your lifestyle, the medicines you take, and whether you have certain medical conditions. Most people can lower their chances of developing kidney stones by following the instructions below. Your dietitian may give you more specific instructions depending on your overall health and the type of kidney stones you tend to develop. What are tips for following this plan? Reading food labels  Choose foods with "no salt added" or "low-salt" labels. Limit your salt (sodium) intake to less than 1,500 mg a day. Choose foods with calcium for each meal and snack. Try to eat about 300 mg of calcium at each meal. Foods that contain 200-500 mg of calcium a serving include: 8 oz (237 mL) of milk, calcium-fortifiednon-dairy milk, and calcium-fortifiedfruit juice. Calcium-fortified means that calcium has been added to these drinks. 8 oz (237 mL) of kefir, yogurt, and soy yogurt. 4 oz (114 g) of tofu. 1 oz (28 g) of cheese. 1 cup (150 g) of dried figs. 1 cup (91 g) of cooked broccoli. One 3 oz (85 g) can of sardines or mackerel. Most people need 1,000-1,500 mg of calcium a day. Talk to your dietitian about how much calcium is recommended for you. Shopping Buy plenty of fresh fruits and vegetables. Most people do not need to avoid fruits and vegetables, even if these foods contain nutrients that may contribute to kidney stones. When shopping for convenience foods, choose: Whole pieces of fruit. Pre-made salads with dressing on the side. Low-fat fruit and yogurt smoothies. Avoid buying frozen meals or prepared deli foods. These can be high in sodium. Look for foods with live cultures, such as yogurt and kefir. Choose high-fiber grains, such as whole-wheat breads, oat bran, and wheat cereals. Cooking Do not add  salt to food when cooking. Place a salt shaker on the table and allow each person to add his or her own salt to taste. Use vegetable protein, such as beans, textured vegetable protein (TVP), or tofu, instead of meat in pasta, casseroles, and soups. Meal planning Eat less salt, if told by your dietitian. To do this: Avoid eating processed or pre-made food. Avoid eating fast food. Eat less animal protein, including cheese, meat, poultry, or fish, if told by your dietitian. To do this: Limit the number of times you have meat, poultry, fish, or cheese each week. Eat a diet free of meat at least 2 days a week. Eat only one serving each day of meat, poultry, fish, or seafood. When you prepare animal protein, cut pieces into small portion sizes. For most meat and fish, one serving is about the size of the palm of your hand. Eat at least five servings of fresh fruits and vegetables each day. To do this: Keep fruits and vegetables on hand for snacks. Eat one piece of fruit or a handful of berries with breakfast. Have a salad and fruit at lunch. Have two kinds of vegetables at dinner. Limit foods that are high in a substance called oxalate. These include: Spinach (cooked), rhubarb, beets, sweet potatoes, and Swiss chard. Peanuts. Potato chips, french fries, and baked potatoes with skin on. Nuts and nut products. Chocolate. If you regularly take a diuretic medicine, make sure to eat at least 1 or 2 servings of fruits or vegetables that are high in potassium each day. These include: Avocado. Banana. Orange, prune,  carrot, or tomato juice. Baked potato. Cabbage. Beans and split peas. Lifestyle  Drink enough fluid to keep your urine pale yellow. This is the most important thing you can do. Spread your fluid intake throughout the day. If you drink alcohol: Limit how much you use to: 0-1 drink a day for women who are not pregnant. 0-2 drinks a day for men. Be aware of how much alcohol is in your  drink. In the [place] one drink equals one 12 oz bottle of beer (355 mL), one 5 oz glass of wine (148 mL), or one 1 oz glass of hard liquor (44 mL). Lose weight if told by your health care provider. Work with your dietitian to find an eating plan and weight loss strategies that work best for you. General information Talk to your health care provider and dietitian about taking daily supplements. You may be told the following depending on your health and the cause of your kidney stones: Not to take supplements with vitamin C. To take a calcium supplement. To take a daily probiotic supplement. To take other supplements such as magnesium, fish oil, or vitamin B6. Take over-the-counter and prescription medicines only as told by your health care provider. These include supplements. What foods should I limit? Limit your intake of the following foods, or eat them as told by your dietitian. Vegetables Spinach. Rhubarb. Beets. Canned vegetables. Angie Fava. Olives. Baked potatoes with skin. Grains Wheat bran. Baked goods. Salted crackers. Cereals high in sugar. Meats and other proteins Nuts. Nut butters. Large portions of meat, poultry, or fish. Salted, precooked, or cured meats, such as sausages, meat loaves, and hot dogs. Dairy Cheese. Beverages Regular soft drinks. Regular vegetable juice. Seasonings and condiments Seasoning blends with salt. Salad dressings. Soy sauce. Ketchup. Barbecue sauce. Other foods Canned soups. Canned pasta sauce. Casseroles. Pizza. Lasagna. Frozen meals. Potato chips. [place] fries. The items listed above may not be a complete list of foods and beverages you should limit. Contact a dietitian for more information. What foods should I avoid? Talk to your dietitian about specific foods you should avoid based on the type of kidney stones you have and your overall health. Fruits Grapefruit. The item listed above may not be a complete list of foods and beverages you should  avoid. Contact a dietitian for more information. Summary Kidney stones are deposits of minerals and salts that form inside your kidneys. You can lower your risk of kidney stones by making changes to your diet. The most important thing you can do is drink enough fluid. Drink enough fluid to keep your urine pale yellow. Talk to your dietitian about how much calcium you should have each day, and eat less salt and animal protein as told by your dietitian. This information is not intended to replace advice given to you by your health care provider. Make sure you discuss any questions you have with your health care provider. Document Revised: 02/28/2021 Document Reviewed: 02/28/2021 Elsevier Patient Education  [place].

## 2022-01-04 ENCOUNTER — Other Ambulatory Visit: Payer: Self-pay | Admitting: Family Medicine

## 2022-01-04 DIAGNOSIS — I10 Essential (primary) hypertension: Secondary | ICD-10-CM
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
## 2022-04-21 NOTE — Progress Notes (Deleted)
Name: Kathleen Villanueva   MRN: [id number]    DOB: Jun 21, 1971   Date:04/21/2022       Progress Note  Subjective  Chief Complaint  Follow Up  HPI  HTN: BP was high  back in 2020 . She is now on losartan hctz  taking just a half pill per day and is doing well .She denies chest pain, palpitation or dizziness. She checks bp at home occasionally and is usually in the 118-122 SBP, she states she develops a headache when bp spikes - but no episodes in a while   DMII: she has been eating a balanced diet, avoiding sweets. She denies polyphagia, polydipsia or polyuria .Last A1C was 6.8 %  and today is up to 7 %. She states she has not been physically active but tolerating Rubelsus well at 7 mg dose. She has dyslipidemia but not ready for medication at this time    Dyslipidemia: she is eating more fish and vegetables , reviewed last labs but she is due for repeat, advised her to go today  The 10-year ASCVD risk score (Arnett DK, et al., 2019) is: 5.7%   Values used to calculate the score:     Age: 51 years     Sex: Female     Is Non-Hispanic African American: No     Diabetic: Yes     Tobacco smoker: No     Systolic Blood Pressure: 865 mmHg     Is BP treated: Yes     HDL Cholesterol: 36 mg/dL     Total Cholesterol: 244 mg/dL    Carpal tunnel syndrome right; she noticed tingling and numbness around May 2021, symptoms are intermittent, improves when off work. She states work load not as heavy and doing well, no problems lately   Patient Active Problem List   Diagnosis Date Noted   Diabetes type 2, controlled ([place]) 78/46/9629   Metabolic syndrome 52/84/1324   Dyslipidemia due to type 2 diabetes mellitus ([place]) 08/13/2018   Microalbuminuria 08/13/2018   GAD (generalized anxiety disorder) 08/25/2016   Allergy to other foods 11/25/2015   H/O renal calculi 11/25/2015   Vitamin D deficiency 11/25/2015   Hyperlipidemia with low HDL 11/25/2015   Overweight (BMI 25.0-29.9) 11/04/2015   Seasonal  allergies 11/04/2015   Chronic constipation 11/04/2015   Other bursal cyst, left ankle and foot 11/04/2015    Past Surgical History:  Procedure Laterality Date   AUGMENTATION MAMMAPLASTY Bilateral 07/03/2004   BREAST ENHANCEMENT SURGERY Bilateral    COLPOSCOPY  07/1995   CYSTOSCOPY  2012   DIAGNOSTIC LAPAROSCOPY  04/13/2000   for chronic RLQ pain; normal anatomy   EXTRACORPOREAL SHOCK WAVE LITHOTRIPSY     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   TUBAL LIGATION      Family History  Problem Relation Age of Onset   Cancer Mother        cervical   Cancer Maternal Uncle        Kidney   Heart attack Father    Hypertension Father    Hypercholesterolemia Father    Gout Father    Gout Brother    Breast cancer Cousin 50    Social History   Tobacco Use   Smoking status: Never   Smokeless tobacco: Never  Substance Use Topics   Alcohol use: No    Alcohol/week: 0.0 standard drinks of alcohol     Current Outpatient Medications:    losartan-hydrochlorothiazide (HYZAAR) 50-12.5 MG tablet, TAKE 1/2 TO 1 TABLET BY  MOUTH DAILY, Disp: 45 tablet, Rfl: 1   rosuvastatin (CRESTOR) 10 MG tablet, Take 1 tablet (10 mg total) by mouth daily., Disp: 90 tablet, Rfl: 1   Semaglutide (RYBELSUS) 7 MG TABS, Take 7 mg by mouth daily., Disp: 90 tablet, Rfl: 1  No Known Allergies  I personally reviewed active problem list, medication list, allergies, family history, social history, health maintenance with the patient/caregiver today.   ROS  ***  Objective  There were no vitals filed for this visit.  There is no height or weight on file to calculate BMI.  Physical Exam ***  No results found for this or any previous visit (from the past 2160 hour(s)).  Diabetic Foot Exam: Diabetic Foot Exam - Simple   No data filed    ***  PHQ2/9:    10/21/2021    8:39 AM 07/15/2021   11:42 AM 04/13/2021    8:11 AM 10/12/2020    3:23 PM 07/16/2020    8:56 AM  Depression screen PHQ 2/9   Decreased Interest 0 0 0 0 0  Down, Depressed, Hopeless 0 0 0 0 0  PHQ - 2 Score 0 0 0 0 0  Altered sleeping 0 0     Tired, decreased energy 0 0     Change in appetite 0 0     Feeling bad or failure about yourself  0 0     Trouble concentrating 0 0     Moving slowly or fidgety/restless 0 0     Suicidal thoughts 0 0     PHQ-9 Score 0 0     Difficult doing work/chores  Not difficult at all       phq 9 is {gen pos [id number]   Fall Risk:    10/21/2021    8:39 AM 07/15/2021   11:42 AM 04/13/2021    8:11 AM 10/12/2020    3:23 PM 07/16/2020    8:56 AM  Fall Risk   Falls in the past year? 0 0 0 0 0  Number falls in past yr: 0 0 0 0 0  Injury with Fall? 0 0 0 0 0  Risk for fall due to : No Fall Risks No Fall Risks No Fall Risks    Follow up Falls prevention discussed Falls prevention discussed Falls prevention discussed        Functional Status Survey:      Assessment & Plan  *** There are no diagnoses linked to this encounter.

## 2022-04-24 ENCOUNTER — Ambulatory Visit: Payer: Managed Care, Other (non HMO) | Admitting: Family Medicine

## 2022-04-24 DIAGNOSIS — E785 Hyperlipidemia, unspecified: Secondary | ICD-10-CM

## 2022-07-31 ENCOUNTER — Other Ambulatory Visit: Payer: Self-pay | Admitting: Family Medicine

## 2022-07-31 DIAGNOSIS — Z1231 Encounter for screening mammogram for malignant neoplasm of breast: Secondary | ICD-10-CM

## 2022-08-24 ENCOUNTER — Other Ambulatory Visit: Payer: Self-pay | Admitting: Family Medicine

## 2022-08-24 DIAGNOSIS — I10 Essential (primary) hypertension: Secondary | ICD-10-CM

## 2022-08-24 MED ORDER — LOSARTAN POTASSIUM-HCTZ 50-12.5 MG PO TABS: 0.5000 | ORAL_TABLET | Freq: Every day | ORAL | 0 refills | Status: DC
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
## 2022-08-24 NOTE — Telephone Encounter (Signed)
Medication Refill - Medication: losartan-hydrochlorothiazide (HYZAAR) 50-12.5 MG tablet [id number]    Has the patient contacted their pharmacy? No. (Agent: If no, request that the patient contact the pharmacy for the refill. If patient does not wish to contact the pharmacy document the reason why and proceed with request.) (Agent: If yes, when and what did the pharmacy advise?)  Preferred Pharmacy (with phone number or street name): [facility] [place], [place]  Has the patient been seen for an appointment in the last year OR does the patient have an upcoming appointment? Yes.    Agent: Please be advised that RX refills may take up to 3 business days. We ask that you follow-up with your pharmacy.

## 2022-08-30 ENCOUNTER — Ambulatory Visit
Admission: RE | Admit: 2022-08-30 | Discharge: 2022-08-30 | Disposition: A | Discharge status: Home / Self Care | Payer: Managed Care, Other (non HMO) | Source: Ambulatory Visit | Attending: Family Medicine | Admitting: Family Medicine

## 2022-08-30 ENCOUNTER — Encounter: Payer: Self-pay | Admitting: Radiology

## 2022-08-30 DIAGNOSIS — Z1231 Encounter for screening mammogram for malignant neoplasm of breast: Secondary | ICD-10-CM | POA: Diagnosis present

## 2022-09-22 LAB — HM DIABETES EYE EXAM

## 2022-10-27 NOTE — Progress Notes (Unsigned)
Name: Kathleen Villanueva   MRN: [id number]    DOB: Nov 14, 1970   Date:10/30/2022       Progress Note  Subjective  Chief Complaint  Medication Refill  HPI  HTN: BP was high  back in 2020 . She is now on losartan hctz  taking just a half pill per day and is doing well .She denies chest pain, palpitation or dizziness. BP is at goal.   DMII: she states her diet has not been good lately, since diabetes is now uncontrolled we will check extra labs . Marland Kitchen She denies polyphagia, polydipsia or polyuria .Last A1C was 6.8 % and today is up to 9 %, she has not been  not been physically active and stopped taking Rybelsus about one month ago . She states it was causing nausea, we will try SGL-2 agonists today. Discussed diabetic teaching class    Dyslipidemia: she needs to resume a healthier diet , explained importance of statin therapy    Carpal tunnel syndrome right; she noticed tingling and numbness around May 2021, symptoms are intermittent, improves when off work. Doing better   Vitamin D deficiency: taking centrum but will add extra vitamin D otc   Patient Active Problem List   Diagnosis Date Noted   Diabetes type 2, controlled ([facility]) 07/15/2021   Metabolic syndrome 08/13/2018   Dyslipidemia due to type 2 diabetes mellitus ([facility]) 08/13/2018   Microalbuminuria 08/13/2018   GAD (generalized anxiety disorder) 08/25/2016   Allergy to other foods 11/25/2015   H/O renal calculi 11/25/2015   Vitamin D deficiency 11/25/2015   Hyperlipidemia with low HDL 11/25/2015   Overweight (BMI 25.0-29.9) 11/04/2015   Seasonal allergies 11/04/2015   Chronic constipation 11/04/2015   Other bursal cyst, left ankle and foot 11/04/2015    Past Surgical History:  Procedure Laterality Date   AUGMENTATION MAMMAPLASTY Bilateral 07/03/2004   BREAST ENHANCEMENT SURGERY Bilateral    COLPOSCOPY  07/1995   CYSTOSCOPY  2012   DIAGNOSTIC LAPAROSCOPY  04/13/2000   for chronic RLQ pain; normal anatomy   EXTRACORPOREAL SHOCK  WAVE LITHOTRIPSY     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   TUBAL LIGATION      Family History  Problem Relation Age of Onset   Cancer Mother        cervical   Cancer Maternal Uncle        Kidney   Heart attack Father    Hypertension Father    Hypercholesterolemia Father    Gout Father    Gout Brother    Breast cancer Cousin 78    Social History   Tobacco Use   Smoking status: Never   Smokeless tobacco: Never  Substance Use Topics   Alcohol use: No    Alcohol/week: 0.0 standard drinks of alcohol     Current Outpatient Medications:    dapagliflozin propanediol (FARXIGA) 10 MG TABS tablet, Take 1 tablet (10 mg total) by mouth daily before breakfast., Disp: 90 tablet, Rfl: 0   fluconazole (DIFLUCAN) 150 MG tablet, Take 1 tablet (150 mg total) by mouth every other day., Disp: 3 tablet, Rfl: 0   rosuvastatin (CRESTOR) 10 MG tablet, Take 1 tablet (10 mg total) by mouth daily., Disp: 90 tablet, Rfl: 1   losartan-hydrochlorothiazide (HYZAAR) 50-12.5 MG tablet, Take 0.5-1 tablets by mouth daily., Disp: 45 tablet, Rfl: 0  No Known Allergies  I personally reviewed active problem list, medication list, allergies, family history, social history, health maintenance with the patient/caregiver today.   ROS  Ten systems reviewed and is negative except as mentioned in HPI    Objective  Vitals:   10/30/22 1145  BP: 124/76  Pulse: 84  Resp: 16  SpO2: 98%  Weight: 127 lb (57.6 kg)  Height: 4\' 10"  (1.473 m)    Body mass index is 26.54 kg/m.  Physical Exam  Constitutional: Patient appears well-developed and well-nourished.  No distress.  HEENT: head atraumatic, normocephalic, pupils equal and reactive to light, neck supple Cardiovascular: Normal rate, regular rhythm and normal heart sounds.  No murmur heard. No BLE edema. Pulmonary/Chest: Effort normal and breath sounds normal. No respiratory distress. Abdominal: Soft.  There is no tenderness. Psychiatric: Patient has  a normal mood and affect. behavior is normal. Judgment and thought content normal.   Diabetic Foot Exam: Diabetic Foot Exam - Simple   Simple Foot Form Visual Inspection No deformities, no ulcerations, no other skin breakdown bilaterally: Yes Sensation Testing Intact to touch and monofilament testing bilaterally: Yes Pulse Check Posterior Tibialis and Dorsalis pulse intact bilaterally: Yes Comments      PHQ2/9:    10/30/2022   11:45 AM 10/21/2021    8:39 AM 07/15/2021   11:42 AM 04/13/2021    8:11 AM 10/12/2020    3:23 PM  Depression screen PHQ 2/9  Decreased Interest 0 0 0 0 0  Down, Depressed, Hopeless 0 0 0 0 0  PHQ - 2 Score 0 0 0 0 0  Altered sleeping 0 0 0    Tired, decreased energy 0 0 0    Change in appetite 0 0 0    Feeling bad or failure about yourself  0 0 0    Trouble concentrating 0 0 0    Moving slowly or fidgety/restless 0 0 0    Suicidal thoughts 0 0 0    PHQ-9 Score 0 0 0    Difficult doing work/chores   Not difficult at all      phq 9 is negative   Fall Risk:    10/30/2022   11:45 AM 10/21/2021    8:39 AM 07/15/2021   11:42 AM 04/13/2021    8:11 AM 10/12/2020    3:23 PM  Fall Risk   Falls in the past year? 0 0 0 0 0  Number falls in past yr: 0 0 0 0 0  Injury with Fall? 0 0 0 0 0  Risk for fall due to : No Fall Risks No Fall Risks No Fall Risks No Fall Risks   Follow up Falls prevention discussed Falls prevention discussed Falls prevention discussed Falls prevention discussed       Functional Status Survey: Is the patient deaf or have difficulty hearing?: No Does the patient have difficulty seeing, even when wearing glasses/contacts?: No Does the patient have difficulty concentrating, remembering, or making decisions?: No Does the patient have difficulty walking or climbing stairs?: No Does the patient have difficulty dressing or bathing?: No Does the patient have difficulty doing errands alone such as visiting a doctor's office or shopping?:  No    Assessment & Plan  1. 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 ratio - HM Diabetes Foot Exam - Comprehensive metabolic panel - Lipid panel - dapagliflozin propanediol (FARXIGA) 10 MG TABS tablet; Take 1 tablet (10 mg total) by mouth daily before breakfast.  Dispense: 90 tablet; Refill: 0 - fluconazole (DIFLUCAN) 150 MG tablet; Take 1 tablet (150 mg total) by mouth every other  day.  Dispense: 3 tablet; Refill: 0  She needs to take statin therapy daily   Discussed diabetic teaching class, she will reach out to her employer first   2. Essential hypertension  Bp is at goal   3. Microalbuminuria  We will start SGL-2 agonist, discussed possible side effects  4. Vitamin D deficiency  - VITAMIN D 25 Hydroxy (Vit-D Deficiency, Fractures)  5. Uncontrolled other specified diabetes mellitus with hyperglycemia (HCC)  - Comprehensive metabolic panel - Glutamic acid decarboxylase auto abs - Anti-islet cell antibody - Insulin and C-Peptide  6. Need for shingles vaccine  She will call insurance to find out about coverage

## 2022-10-30 ENCOUNTER — Ambulatory Visit: Payer: Managed Care, Other (non HMO) | Admitting: Family Medicine

## 2022-10-30 ENCOUNTER — Encounter: Payer: Self-pay | Admitting: Family Medicine

## 2022-10-30 VITALS — BP 124/76 | HR 84 | Resp 16 | Ht <= 58 in | Wt 127.0 lb

## 2022-10-30 DIAGNOSIS — E1169 Type 2 diabetes mellitus with other specified complication: Secondary | ICD-10-CM

## 2022-10-30 DIAGNOSIS — I10 Essential (primary) hypertension: Secondary | ICD-10-CM | POA: Diagnosis not present

## 2022-10-30 DIAGNOSIS — E559 Vitamin D deficiency, unspecified: Secondary | ICD-10-CM

## 2022-10-30 DIAGNOSIS — R809 Proteinuria, unspecified: Secondary | ICD-10-CM

## 2022-10-30 DIAGNOSIS — E785 Hyperlipidemia, unspecified: Secondary | ICD-10-CM

## 2022-10-30 DIAGNOSIS — E1365 Other specified diabetes mellitus with hyperglycemia: Secondary | ICD-10-CM

## 2022-10-30 DIAGNOSIS — E1129 Type 2 diabetes mellitus with other diabetic kidney complication: Secondary | ICD-10-CM

## 2022-10-30 DIAGNOSIS — Z23 Encounter for immunization: Secondary | ICD-10-CM

## 2022-10-30 LAB — POCT GLYCOSYLATED HEMOGLOBIN (HGB A1C): Hemoglobin A1C: 9 % — AB (ref 4.0–5.6)

## 2022-10-30 MED ORDER — DAPAGLIFLOZIN PROPANEDIOL 10 MG PO TABS
10.0000 mg | ORAL_TABLET | Freq: Every day | ORAL | 0 refills | Status: DC
Start: 2022-10-30 — End: 2023-01-09

## 2022-10-30 MED ORDER — FLUCONAZOLE 150 MG PO TABS
150.0000 mg | ORAL_TABLET | ORAL | 0 refills | Status: DC
Start: 2022-10-30 — End: 2023-03-20

## 2022-10-30 MED ORDER — LOSARTAN POTASSIUM-HCTZ 50-12.5 MG PO TABS
0.5000 | ORAL_TABLET | Freq: Every day | ORAL | 0 refills | Status: DC
Start: 2022-10-30 — End: 2022-12-10

## 2022-11-08 ENCOUNTER — Ambulatory Visit: Payer: Managed Care, Other (non HMO) | Admitting: Nurse Practitioner

## 2022-11-08 ENCOUNTER — Ambulatory Visit: Payer: Self-pay | Admitting: *Deleted

## 2022-11-08 NOTE — Progress Notes (Deleted)
   There were no vitals taken for this visit.   Subjective:    Patient ID: Kathleen Villanueva, female    DOB: 09/01/1970, 52 y.o.   MRN: [id number]  HPI: Kathleen Villanueva is a 52 y.o. female  No chief complaint on file.   Relevant past medical, surgical, family and social history reviewed and updated as indicated. Interim medical history since our last visit reviewed. Allergies and medications reviewed and updated.  Review of Systems  Constitutional: Negative for fever or weight change.  Respiratory: Negative for cough and shortness of breath.   Cardiovascular: Negative for chest pain or palpitations.  Gastrointestinal: Negative for abdominal pain, no bowel changes.  Musculoskeletal: Negative for gait problem or joint swelling.  Skin: Negative for rash.  Neurological: Negative for dizziness or headache.  No other specific complaints in a complete review of systems (except as listed in HPI above).      Objective:    There were no vitals taken for this visit.  Wt Readings from Last 3 Encounters:  10/30/22 127 lb (57.6 kg)  12/30/21 129 lb 1.6 oz (58.6 kg)  12/13/21 130 lb (59 kg)    Physical Exam  Constitutional: Patient appears well-developed and well-nourished. Obese *** No distress.  HEENT: head atraumatic, normocephalic, pupils equal and reactive to light, ears ***, neck supple, throat within normal limits Cardiovascular: Normal rate, regular rhythm and normal heart sounds.  No murmur heard. No BLE edema. Pulmonary/Chest: Effort normal and breath sounds normal. No respiratory distress. Abdominal: Soft.  There is no tenderness. Psychiatric: Patient has a normal mood and affect. behavior is normal. Judgment and thought content normal.  Results for orders placed or performed in visit on 10/30/22  POCT HgB A1C  Result Value Ref Range   Hemoglobin A1C 9.0 (A) 4.0 - 5.6 %   HbA1c POC (<> result, manual entry)     HbA1c, POC (prediabetic range)     HbA1c, POC (controlled  diabetic range)        Assessment & Plan:   Problem List Items Addressed This Visit   None    Follow up plan: No follow-ups on file.

## 2022-11-08 NOTE — Telephone Encounter (Signed)
  Chief Complaint: Arm Pain Symptoms: Left arm pain, localized at elbow. 10/10 when "Stretching arm with forearm up." States hears pop when turning wrist. Taking IBU, ineffective. Frequency: 10/31/22 Pertinent Negatives: Patient denies injury, swelling, redness, warmth Disposition: [] ED /[] Urgent Care (no appt availability in office) / [x] Appointment(In office/virtual)/ []  [facility]/ [] Home Care/ [] Refused Recommended Disposition /[] [facility]/ []  Follow-up with PCP Additional Notes: Appt secured for this afternoon. Care advise provided, pt verbalizes understanding. Reason for Disposition  Numbness (i.e., loss of sensation) in hand or fingers  Answer Assessment - Initial Assessment Questions 1. ONSET: "When did the pain start?"     Around 10/31/22 2. LOCATION: "Where is the pain located?"     Left elbow 3. PAIN: "How bad is the pain?" (Scale 1-10; or mild, moderate, severe)   - MILD (1-3): Doesn't interfere with normal activities.   - MODERATE (4-7): Interferes with normal activities (e.g., work or school) or awakens from sleep.   - SEVERE (8-10): Excruciating pain, unable to do any normal activities, unable to hold a cup of water.     Hurts with stretching.   10/10 only with stretching. 4. WORK OR EXERCISE: "Has there been any recent work or exercise that involved this part of the body?"     No 5. CAUSE: "What do you think is causing the arm pain?"     Unsure 6. OTHER SYMPTOMS: "Do you have any other symptoms?" (e.g., neck pain, swelling, rash, fever, numbness, weakness)     No. Pops when moving wrist.  Protocols used: Arm Pain-A-AH

## 2022-12-09 ENCOUNTER — Other Ambulatory Visit: Payer: Self-pay | Admitting: Family Medicine

## 2022-12-09 DIAGNOSIS — I10 Essential (primary) hypertension: Secondary | ICD-10-CM

## 2023-01-05 ENCOUNTER — Other Ambulatory Visit: Payer: Self-pay | Admitting: Family Medicine

## 2023-01-05 DIAGNOSIS — I10 Essential (primary) hypertension: Secondary | ICD-10-CM

## 2023-01-08 ENCOUNTER — Other Ambulatory Visit: Payer: Self-pay | Admitting: Family Medicine

## 2023-01-08 DIAGNOSIS — E785 Hyperlipidemia, unspecified: Secondary | ICD-10-CM

## 2023-01-09 ENCOUNTER — Other Ambulatory Visit: Payer: Self-pay | Admitting: Family Medicine

## 2023-01-09 DIAGNOSIS — E1169 Type 2 diabetes mellitus with other specified complication: Secondary | ICD-10-CM

## 2023-01-12 ENCOUNTER — Other Ambulatory Visit: Payer: Self-pay | Admitting: Family Medicine

## 2023-01-12 DIAGNOSIS — E785 Hyperlipidemia, unspecified: Secondary | ICD-10-CM

## 2023-01-12 MED ORDER — DAPAGLIFLOZIN PROPANEDIOL 10 MG PO TABS
10.0000 mg | ORAL_TABLET | Freq: Every day | ORAL | 0 refills | Status: DC
Start: 2023-01-12 — End: 2023-01-29

## 2023-01-12 NOTE — Telephone Encounter (Signed)
Pt reports that she was told by her pharmacy that the Rx for FARXIGA 10 MG TABS tablet is out of stock and to request that the Rx be transferred to  [id number] [place], [place] - [address] AT [facility] OF [address] Phone: [phone]  Fax: [phone]

## 2023-01-12 NOTE — Telephone Encounter (Signed)
Resend to another pharmacy, [facility] in [place].
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
## 2023-01-23 LAB — MICROALBUMIN / CREATININE URINE RATIO: Creatinine, Urine: 26.1 mg/dL

## 2023-01-23 LAB — COMPREHENSIVE METABOLIC PANEL
ALT: 23 IU/L (ref 0–32)
Alkaline Phosphatase: 47 IU/L (ref 44–121)
Chloride: 99 mmol/L (ref 96–106)
Globulin, Total: 3.6 g/dL (ref 1.5–4.5)
Total Protein: 8 g/dL (ref 6.0–8.5)
eGFR: 92 mL/min/{1.73_m2} (ref >59)

## 2023-01-23 LAB — INSULIN AND C-PEPTIDE, SERUM
C-Peptide: 3.5 ng/mL (ref 1.1–4.4)
INSULIN: 19.1 u[IU]/mL (ref 2.6–24.9)

## 2023-01-23 LAB — LIPID PANEL: Triglycerides: 314 mg/dL — ABNORMAL HIGH (ref 0–149)

## 2023-01-24 LAB — GLUTAMIC ACID DECARBOXYLASE AUTO ABS

## 2023-01-24 LAB — COMPREHENSIVE METABOLIC PANEL
AST: 20 IU/L (ref 0–40)
Albumin: 4.4 g/dL (ref 3.9–4.9)
BUN/Creatinine Ratio: 27 — ABNORMAL HIGH (ref 9–23)
BUN: 21 mg/dL (ref 6–24)
Bilirubin Total: 0.4 mg/dL (ref 0.0–1.2)
CO2: 23 mmol/L (ref 20–29)
Calcium: 9.7 mg/dL (ref 8.7–10.2)
Creatinine, Ser: 0.78 mg/dL (ref 0.57–1.00)
Glucose: 163 mg/dL — ABNORMAL HIGH (ref 70–99)
Potassium: 4.9 mmol/L (ref 3.5–5.2)
Sodium: 137 mmol/L (ref 134–144)

## 2023-01-24 LAB — LIPID PANEL
Chol/HDL Ratio: 6 ratio — ABNORMAL HIGH (ref 0.0–4.4)
Cholesterol, Total: 216 mg/dL — ABNORMAL HIGH (ref 100–199)
HDL: 36 mg/dL — ABNORMAL LOW (ref >39)
LDL Chol Calc (NIH): 124 mg/dL — ABNORMAL HIGH (ref 0–99)
VLDL Cholesterol Cal: 56 mg/dL — ABNORMAL HIGH (ref 5–40)

## 2023-01-24 LAB — VITAMIN D 25 HYDROXY (VIT D DEFICIENCY, FRACTURES): Vit D, 25-Hydroxy: 20.5 ng/mL — ABNORMAL LOW (ref 30.0–100.0)

## 2023-01-24 LAB — MICROALBUMIN / CREATININE URINE RATIO: Microalbumin, Urine: 102.1 ug/mL

## 2023-01-26 NOTE — Progress Notes (Unsigned)
Name: Kathleen Villanueva   MRN: [id number]    DOB: 1970/09/28   Date:01/29/2023       Progress Note  Subjective  Chief Complaint  Follow Up  HPI  HTN: BP was high  back in 2020 . She is now on losartan hctz  taking just a half pill per day and BP is at goal. .She denies chest pain, palpitation or dizziness.  DMII: with associated HTN, dyslipidemia and microalbuminuria . She denies polyphagia, polydipsia or polyuria .Last A1C was 9 % today is down to 7.1 % , she is on Farxiga and is wiling to try taking Rybelsus again if nausea returns we will switch to Actos . She denies polyphagia, polydipsia and polyuria . Eye exam is up to date    Dyslipidemia: she is taking statin therapy but LDL not at goal , she wants to continue current dose for now   Vitamin D deficiency: taking centrum and wants rx vitamin D , does not like taking otc medications   Patient Active Problem List   Diagnosis Date Noted   Diabetes type 2, controlled (HCC) 07/15/2021   Metabolic syndrome 08/13/2018   Dyslipidemia due to type 2 diabetes mellitus (HCC) 08/13/2018   Microalbuminuria 08/13/2018   GAD (generalized anxiety disorder) 08/25/2016   Allergy to other foods 11/25/2015   H/O renal calculi 11/25/2015   Vitamin D deficiency 11/25/2015   Hyperlipidemia with low HDL 11/25/2015   Overweight (BMI 25.0-29.9) 11/04/2015   Seasonal allergies 11/04/2015   Chronic constipation 11/04/2015   Other bursal cyst, left ankle and foot 11/04/2015    Past Surgical History:  Procedure Laterality Date   AUGMENTATION MAMMAPLASTY Bilateral 07/03/2004   BREAST ENHANCEMENT SURGERY Bilateral    COLPOSCOPY  07/1995   CYSTOSCOPY  2012   DIAGNOSTIC LAPAROSCOPY  04/13/2000   for chronic RLQ pain; normal anatomy   EXTRACORPOREAL SHOCK WAVE LITHOTRIPSY     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 TAKE 1 TABLET(10 MG) BY MOUTH DAILY BEFORE BREAKFAST, Disp: 90 tablet, Rfl: 0   fluconazole (DIFLUCAN) 150 MG tablet, Take 1 tablet (150 mg total) by mouth every other day., Disp: 3 tablet, Rfl: 0   losartan-hydrochlorothiazide (HYZAAR) 50-12.5 MG tablet, TAKE 1/2 TO 1 TABLET BY MOUTH DAILY, Disp: 90 tablet, Rfl: 0   rosuvastatin (CRESTOR) 10 MG tablet, Take 1 tablet (10 mg total) by mouth daily., Disp: 90 tablet, Rfl: 1  No Known Allergies  I personally reviewed active problem list, medication list, allergies, family history, social history, health maintenance with the patient/caregiver today.   ROS  Constitutional: Negative for fever or weight change.  Respiratory: Negative for cough and shortness of breath.   Cardiovascular: Negative for chest  pain or palpitations.  Gastrointestinal: Negative for abdominal pain, no bowel changes.  Musculoskeletal: Negative for gait problem or joint swelling.  Skin: Negative for rash.  Neurological: Negative for dizziness or headache.  No other specific complaints in a complete review of systems (except as listed in HPI above).   Objective  Vitals:   01/29/23 0929  BP: 120/72  Pulse: 83  Resp: 16  SpO2: 99%  Weight: 123 lb (55.8 kg)  Height: 4\' 10"  (1.473 m)    Body mass index is 25.71 kg/m.  Physical Exam  Constitutional: Patient appears well-developed and well-nourished. No distress.  HEENT: head atraumatic, normocephalic, pupils equal and reactive to light, neck  supple Cardiovascular: Normal rate, regular rhythm and normal heart sounds.  No murmur heard. No BLE edema. Pulmonary/Chest: Effort normal and breath sounds normal. No respiratory distress. Abdominal: Soft.  There is no tenderness. Psychiatric: Patient has a normal mood and affect. behavior is normal. Judgment and thought content normal.    PHQ2/9:    01/29/2023    9:29 AM 10/30/2022   11:45 AM 10/21/2021    8:39 AM 07/15/2021   11:42 AM 04/13/2021    8:11 AM  Depression screen PHQ 2/9  Decreased Interest 0 0 0 0 0  Down, Depressed, Hopeless 0 0 0 0 0  PHQ - 2 Score 0 0 0 0 0  Altered sleeping 0 0 0 0   Tired, decreased energy 0 0 0 0   Change in appetite 0 0 0 0   Feeling bad or failure about yourself  0 0 0 0   Trouble concentrating 0 0 0 0   Moving slowly or fidgety/restless 0 0 0 0   Suicidal thoughts 0 0 0 0   PHQ-9 Score 0 0 0 0   Difficult doing work/chores    Not difficult at all     phq 9 is negative   Fall Risk:    01/29/2023    9:29 AM 10/30/2022   11:45 AM 10/21/2021    8:39 AM 07/15/2021   11:42 AM 04/13/2021    8:11 AM  Fall Risk   Falls in the past year? 0 0 0 0 0  Number falls in past yr: 0 0 0 0 0  Injury with Fall? 0 0 0 0 0  Risk for fall due to : No Fall Risks No Fall Risks No Fall Risks No Fall Risks No Fall Risks  Follow up Falls prevention discussed Falls prevention discussed Falls prevention discussed Falls prevention discussed Falls prevention discussed      Functional Status Survey: Is the patient deaf or have difficulty hearing?: No Does the patient have difficulty seeing, even when wearing glasses/contacts?: No Does the patient have difficulty concentrating, remembering, or making decisions?: No Does the patient have difficulty walking or climbing stairs?: No Does the patient have difficulty dressing or bathing?: No Does the patient have difficulty doing errands alone such as visiting a doctor's office or shopping?: No    Assessment &  Plan  1. 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 TAKE 1 TABLET(10 MG) BY MOUTH DAILY BEFORE BREAKFAST  Dispense: 90 tablet; Refill: 0 - Semaglutide (RYBELSUS) 7 MG TABS; Take 1 tablet (7 mg total) by mouth daily.  Dispense: 90 tablet; Refill: 0  2. Controlled type 2 diabetes mellitus with microalbuminuria, without long-term current use of  insulin (HCC)  On ARB and SGL-2 agonist   3. Essential hypertension  BP is at goal   4. Vitamin D deficiency  - Vitamin D, Ergocalciferol, (DRISDOL) 1.25 MG (50000 UNIT) CAPS capsule; Take 1 capsule (50,000 Units total) by mouth every 7 (seven) days.  Dispense: 12 capsule; Refill: 1  5. Dyslipidemia  On  statin therapy, LDL still elevated  6. Microalbuminuria  On ARB and SGL-2 agonist

## 2023-01-29 ENCOUNTER — Ambulatory Visit: Payer: Managed Care, Other (non HMO) | Admitting: Family Medicine

## 2023-01-29 VITALS — BP 120/72 | HR 83 | Resp 16 | Ht <= 58 in | Wt 123.0 lb

## 2023-01-29 DIAGNOSIS — E1129 Type 2 diabetes mellitus with other diabetic kidney complication: Secondary | ICD-10-CM | POA: Diagnosis not present

## 2023-01-29 DIAGNOSIS — E559 Vitamin D deficiency, unspecified: Secondary | ICD-10-CM

## 2023-01-29 DIAGNOSIS — E1169 Type 2 diabetes mellitus with other specified complication: Secondary | ICD-10-CM | POA: Diagnosis not present

## 2023-01-29 DIAGNOSIS — E785 Hyperlipidemia, unspecified: Secondary | ICD-10-CM

## 2023-01-29 DIAGNOSIS — R809 Proteinuria, unspecified: Secondary | ICD-10-CM

## 2023-01-29 DIAGNOSIS — I10 Essential (primary) hypertension: Secondary | ICD-10-CM

## 2023-01-29 LAB — POCT GLYCOSYLATED HEMOGLOBIN (HGB A1C): Hemoglobin A1C: 7.1 % — AB (ref 4.0–5.6)

## 2023-01-29 MED ORDER — VITAMIN D (ERGOCALCIFEROL) 1.25 MG (50000 UNIT) PO CAPS
50000.0000 [IU] | ORAL_CAPSULE | ORAL | 1 refills | Status: DC
Start: 2023-01-29 — End: 2023-08-02

## 2023-01-29 MED ORDER — DAPAGLIFLOZIN PROPANEDIOL 10 MG PO TABS
10.0000 mg | ORAL_TABLET | Freq: Every day | ORAL | 0 refills | Status: DC
Start: 2023-01-29 — End: 2023-04-10

## 2023-01-29 MED ORDER — RYBELSUS 7 MG PO TABS: 7.0000 mg | ORAL_TABLET | Freq: Every day | ORAL | 0 refills | Status: DC

## 2023-01-29 MED ORDER — PIOGLITAZONE HCL 15 MG PO TABS: 15.0000 mg | ORAL_TABLET | Freq: Every day | ORAL | 0 refills | Status: DC

## 2023-03-13 DIAGNOSIS — N870 Mild cervical dysplasia: Secondary | ICD-10-CM | POA: Insufficient documentation

## 2023-03-13 NOTE — Progress Notes (Unsigned)
PCP: Alba Cory, MD   No chief complaint on file.   HPI:      Kathleen Villanueva is a 52 y.o. [id number] whose LMP was No LMP recorded. Patient is perimenopausal., presents today for her annual examination.  Her menses are irregular since 12/22. Menses were regular every 28-30 days, lasting 6-7 days, mod flow, no BTB,  Dysmenorrhea mild. No menses from 12/22 to 4/23, then it lasted 3-4 days, lighter flow, no dysmen. No BTB. No vasomotor sx.   Sex activity: single partner, contraception - tubal ligation. She does not have vaginal dryness/pain   Last Pap: 12/05/19  Results were: no abnormalities /neg HPV DNA.  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 There is no FH of ovarian cancer. The patient does do self-breast exams.  Colonoscopy: NEG cologuard 7/22, repeat due after 3 yrs  Tobacco use: The patient denies current or previous tobacco use. Alcohol use: none No drug use Exercise: min active  She does get adequate calcium but not Vitamin D in her diet. Hx of Vit D deficiency, followed by PCP  Labs with PCP.   Past Medical History:  Diagnosis Date   Allergy    Anxiety    History of gestational diabetes 2006/ 2009   with second and third pregnancy   History of nephrolithiasis    seen by Dr. 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   TUBAL LIGATION      Family History  Problem Relation Age of Onset   Cancer Mother        cervical   Cancer Maternal Uncle        Kidney   Heart attack Father    Hypertension Father    Hypercholesterolemia Father    Gout Father    Gout Brother    Breast cancer Cousin 57    Social History   Socioeconomic History   Marital status: Married    Spouse name: Not on file   Number of children: 3   Years of education: Not on file   Highest education level: Some college, no degree  Occupational History   Occupation: Glass blower/designer     Comment: [employer]   Tobacco Use   Smoking status: Never   Smokeless tobacco: Never  Vaping Use   Vaping status: Never Used  Substance and Sexual Activity   Alcohol use: No    Alcohol/week: 0.0 standard drinks of alcohol   Drug use: No   Sexual activity: Yes    Partners: Male    Birth control/protection: Surgical    Comment: Tubal ligation  Other Topics Concern   Not on file  Social History Narrative  She works at [employer] for many years, but had a promotion Nov 2018 and states wants to go down again, too stressed    She is budist   Social Determinants of Corporate investment banker Strain: Low Risk  (01/29/2023)   Overall Financial Resource Strain (CARDIA)    Difficulty of Paying Living Expenses: Not very hard  Food Insecurity: No Food Insecurity (01/29/2023)   Hunger Vital Sign    Worried About Running Out of Food in the Last Year: Never true    Ran Out of Food in the Last Year: Never true  Transportation Needs: No Transportation Needs (01/29/2023)   PRAPARE - Administrator, Civil Service (Medical): No    Lack of Transportation (Non-Medical): No  Physical Activity: Insufficiently Active (01/29/2023)   Exercise Vital Sign    Days of Exercise per Week: 2 days    Minutes of Exercise per Session: 20 min  Stress: No Stress Concern Present (01/29/2023)    [employer] of Occupational Health - Occupational Stress Questionnaire    Feeling of Stress : Only a little  Social Connections: Moderately Isolated (01/29/2023)   Social Connection and Isolation Panel [NHANES]    Frequency of Communication with Friends and Family: More than three times a week    Frequency of Social Gatherings with Friends and Family: More than three times a week    Attends Religious Services: Never    Database administrator or Organizations: No    Attends Engineer, structural: Not on file    Marital Status: Married  Catering manager Violence: Not At Risk (05/14/2020)   Humiliation, Afraid, Rape, and Kick questionnaire    Fear of Current or Ex-Partner: No    Emotionally Abused: No    Physically Abused: No    Sexually Abused: No     Current Outpatient Medications:    dapagliflozin propanediol (FARXIGA) 10 MG TABS tablet, Take 1 tablet (10 mg total) by mouth daily. TAKE 1 TABLET(10 MG) BY MOUTH DAILY BEFORE BREAKFAST, Disp: 90 tablet, Rfl: 0   fluconazole (DIFLUCAN) 150 MG tablet, Take 1 tablet (150 mg total) by mouth every other day., Disp: 3 tablet, Rfl: 0   losartan-hydrochlorothiazide (HYZAAR) 50-12.5 MG tablet, TAKE 1/2 TO 1 TABLET BY MOUTH DAILY, Disp: 90 tablet, Rfl: 0   rosuvastatin (CRESTOR) 10 MG tablet, Take 1 tablet (10 mg total) by mouth daily., Disp: 90 tablet, Rfl: 1   Semaglutide (RYBELSUS) 7 MG TABS, Take 1 tablet (7 mg total) by mouth daily., Disp: 90 tablet, Rfl: 0   Vitamin D, Ergocalciferol, (DRISDOL) 1.25 MG (50000 UNIT) CAPS capsule, Take 1 capsule (50,000 Units total) by mouth every 7 (seven) days., Disp: 12 capsule, Rfl: 1     ROS:  Review of Systems  Constitutional:  Negative for fatigue, fever and unexpected weight change.  Respiratory:  Negative for cough, shortness of breath and wheezing.   Cardiovascular:  Negative for chest pain, palpitations and leg swelling.  Gastrointestinal:  Negative for blood in stool,  constipation, diarrhea, nausea and vomiting.  Endocrine: Negative for cold intolerance, heat intolerance and polyuria.  Genitourinary:  Negative for dyspareunia, dysuria, flank pain, frequency, genital sores, hematuria, menstrual problem, pelvic pain, urgency, vaginal bleeding, vaginal discharge and vaginal pain.  Musculoskeletal:  Negative for back pain, joint swelling and myalgias.  Skin:  Negative for rash.  Neurological:  Negative for dizziness, syncope, light-headedness, numbness and headaches.  Hematological:  Negative for adenopathy.  Psychiatric/Behavioral:  Negative for agitation, confusion,  sleep disturbance and suicidal ideas. The patient is not nervous/anxious.   BREAST: No symptoms    Objective: There were no vitals taken for this visit.   Physical Exam Constitutional:      Appearance: She is well-developed.  Genitourinary:     Vulva normal.     Right Labia: No rash, tenderness or lesions.    Left Labia: No tenderness, lesions or rash.    No vaginal discharge, erythema or tenderness.      Right Adnexa: not tender and no mass present.    Left Adnexa: not tender and no mass present.    No cervical friability or polyp.     Uterus is not enlarged or tender.  Breasts:    Right: No mass, nipple discharge, skin change or tenderness.     Left: No mass, nipple discharge, skin change or tenderness.  Neck:     Thyroid: No thyromegaly.  Cardiovascular:     Rate and Rhythm: Normal rate and regular rhythm.     Heart sounds: Normal heart sounds. No murmur heard. Pulmonary:     Effort: Pulmonary effort is normal.     Breath sounds: Normal breath sounds.  Abdominal:     Palpations: Abdomen is soft.     Tenderness: There is no abdominal tenderness. There is no guarding or rebound.  Musculoskeletal:        General: Normal range of motion.     Cervical back: Normal range of motion.  Lymphadenopathy:     Cervical: No cervical adenopathy.  Neurological:     General: No focal  deficit present.     Mental Status: She is alert and oriented to person, place, and time.     Cranial Nerves: No cranial nerve deficit.  Skin:    General: Skin is warm and dry.  Psychiatric:        Mood and Affect: Mood normal.        Behavior: Behavior normal.        Thought Content: Thought content normal.        Judgment: Judgment normal.  Vitals reviewed.     Assessment/Plan:  Encounter for annual routine gynecological examination  Encounter for screening mammogram for malignant neoplasm of breast - Plan: MM 3D SCREEN BREAST BILATERAL; pt to schedule mammo 2/24  Perimenopause--irreg menses since 12/22; f/u prn AUB          GYN counsel breast self exam, mammography screening, menopause, adequate intake of calcium and vitamin D, diet and exercise    F/U  No follow-ups on file.  Kathleen Fifer B. Breshay Ilg, PA-C 03/13/2023 2:25 PM

## 2023-03-20 ENCOUNTER — Encounter: Payer: Self-pay | Admitting: Obstetrics and Gynecology

## 2023-03-20 ENCOUNTER — Ambulatory Visit (INDEPENDENT_AMBULATORY_CARE_PROVIDER_SITE_OTHER): Payer: Managed Care, Other (non HMO) | Admitting: Obstetrics and Gynecology

## 2023-03-20 VITALS — BP 120/84 | Ht <= 58 in | Wt 124.0 lb

## 2023-03-20 DIAGNOSIS — Z01419 Encounter for gynecological examination (general) (routine) without abnormal findings: Secondary | ICD-10-CM | POA: Diagnosis not present

## 2023-03-20 DIAGNOSIS — Z124 Encounter for screening for malignant neoplasm of cervix: Secondary | ICD-10-CM

## 2023-03-20 DIAGNOSIS — Z1231 Encounter for screening mammogram for malignant neoplasm of breast: Secondary | ICD-10-CM

## 2023-03-20 DIAGNOSIS — Z1151 Encounter for screening for human papillomavirus (HPV): Secondary | ICD-10-CM

## 2023-03-20 DIAGNOSIS — N951 Menopausal and female climacteric states: Secondary | ICD-10-CM

## 2023-03-20 DIAGNOSIS — N870 Mild cervical dysplasia: Secondary | ICD-10-CM

## 2023-03-20 NOTE — Patient Instructions (Signed)
I value your feedback and you entrusting us with your care. If you get a [facility] patient survey, I would appreciate you taking the time to let us know about your experience today. Thank you! ? ? ?

## 2023-03-25 LAB — IGP, APTIMA HPV: HPV Aptima: NEGATIVE

## 2023-04-09 ENCOUNTER — Other Ambulatory Visit: Payer: Self-pay | Admitting: Family Medicine

## 2023-04-09 DIAGNOSIS — E1169 Type 2 diabetes mellitus with other specified complication: Secondary | ICD-10-CM
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
## 2023-05-02 ENCOUNTER — Ambulatory Visit: Payer: Managed Care, Other (non HMO) | Admitting: Family Medicine

## 2023-05-02 ENCOUNTER — Encounter: Payer: Self-pay | Admitting: Family Medicine

## 2023-05-02 VITALS — BP 114/72 | HR 90 | Temp 97.7°F | Resp 14 | Ht <= 58 in | Wt 124.2 lb

## 2023-05-02 DIAGNOSIS — I10 Essential (primary) hypertension: Secondary | ICD-10-CM | POA: Diagnosis not present

## 2023-05-02 DIAGNOSIS — E559 Vitamin D deficiency, unspecified: Secondary | ICD-10-CM

## 2023-05-02 DIAGNOSIS — F40298 Other specified phobia: Secondary | ICD-10-CM

## 2023-05-02 DIAGNOSIS — E1169 Type 2 diabetes mellitus with other specified complication: Secondary | ICD-10-CM | POA: Diagnosis not present

## 2023-05-02 DIAGNOSIS — R809 Proteinuria, unspecified: Secondary | ICD-10-CM

## 2023-05-02 DIAGNOSIS — E785 Hyperlipidemia, unspecified: Secondary | ICD-10-CM

## 2023-05-02 DIAGNOSIS — E1129 Type 2 diabetes mellitus with other diabetic kidney complication: Secondary | ICD-10-CM | POA: Diagnosis not present

## 2023-05-02 LAB — POCT GLYCOSYLATED HEMOGLOBIN (HGB A1C): Hemoglobin A1C: 8.5 % — AB (ref 4.0–5.6)

## 2023-05-02 MED ORDER — LOSARTAN POTASSIUM 50 MG PO TABS
50.0000 mg | ORAL_TABLET | Freq: Every day | ORAL | 0 refills | Status: DC
Start: 2023-05-02 — End: 2023-08-02

## 2023-05-02 MED ORDER — ROSUVASTATIN CALCIUM 10 MG PO TABS
10.0000 mg | ORAL_TABLET | Freq: Every day | ORAL | 0 refills | Status: DC
Start: 2023-05-02 — End: 2023-08-02

## 2023-05-02 MED ORDER — METFORMIN HCL ER 750 MG PO TB24
1500.0000 mg | ORAL_TABLET | Freq: Every day | ORAL | 0 refills | Status: DC
Start: 2023-05-02 — End: 2023-08-02

## 2023-05-02 NOTE — Patient Instructions (Addendum)
Quinoa  Cauliflower rice

## 2023-05-07 NOTE — Telephone Encounter (Signed)
Copied from CRM [id number]. Topic: General - Other >> May 04, 2023  2:19 PM Phill Myron wrote: Per our discussion Dr Carlynn Purl, you mentioned an injection I could take to bring my sugar levels down:  Can I have more information on it and is it covered by insurance? Please advise

## 2023-06-04 ENCOUNTER — Other Ambulatory Visit: Payer: Self-pay | Admitting: Family Medicine

## 2023-06-04 DIAGNOSIS — E559 Vitamin D deficiency, unspecified: Secondary | ICD-10-CM

## 2023-07-02 ENCOUNTER — Other Ambulatory Visit: Payer: Self-pay | Admitting: Family Medicine

## 2023-07-02 DIAGNOSIS — E1169 Type 2 diabetes mellitus with other specified complication: Secondary | ICD-10-CM

## 2023-07-02 MED ORDER — DAPAGLIFLOZIN PROPANEDIOL 10 MG PO TABS: 10.0000 mg | ORAL_TABLET | Freq: Every day | ORAL | 0 refills | Status: DC

## 2023-08-02 ENCOUNTER — Encounter: Payer: Self-pay | Admitting: Family Medicine

## 2023-08-02 ENCOUNTER — Ambulatory Visit: Payer: Managed Care, Other (non HMO) | Admitting: Family Medicine

## 2023-08-02 VITALS — BP 120/76 | HR 81 | Temp 97.7°F | Resp 16 | Ht <= 58 in | Wt 120.5 lb

## 2023-08-02 DIAGNOSIS — E1169 Type 2 diabetes mellitus with other specified complication: Secondary | ICD-10-CM

## 2023-08-02 DIAGNOSIS — Z79899 Other long term (current) drug therapy: Secondary | ICD-10-CM

## 2023-08-02 DIAGNOSIS — I10 Essential (primary) hypertension: Secondary | ICD-10-CM

## 2023-08-02 DIAGNOSIS — R809 Proteinuria, unspecified: Secondary | ICD-10-CM

## 2023-08-02 DIAGNOSIS — Z7984 Long term (current) use of oral hypoglycemic drugs: Secondary | ICD-10-CM

## 2023-08-02 DIAGNOSIS — E1129 Type 2 diabetes mellitus with other diabetic kidney complication: Secondary | ICD-10-CM

## 2023-08-02 DIAGNOSIS — E559 Vitamin D deficiency, unspecified: Secondary | ICD-10-CM

## 2023-08-02 DIAGNOSIS — E785 Hyperlipidemia, unspecified: Secondary | ICD-10-CM | POA: Diagnosis not present

## 2023-08-02 LAB — POCT GLYCOSYLATED HEMOGLOBIN (HGB A1C): Hemoglobin A1C: 6.6 % — AB (ref 4.0–5.6)

## 2023-08-02 MED ORDER — ROSUVASTATIN CALCIUM 10 MG PO TABS: 10.0000 mg | ORAL_TABLET | Freq: Every day | ORAL | 1 refills | Status: DC

## 2023-08-02 MED ORDER — VITAMIN D (ERGOCALCIFEROL) 1.25 MG (50000 UNIT) PO CAPS: 50000.0000 [IU] | ORAL_CAPSULE | ORAL | 1 refills | Status: DC

## 2023-08-02 MED ORDER — LOSARTAN POTASSIUM 50 MG PO TABS: 50.0000 mg | ORAL_TABLET | Freq: Every day | ORAL | 1 refills | Status: DC

## 2023-08-02 MED ORDER — DAPAGLIFLOZIN PRO-METFORMIN ER 10-1000 MG PO TB24: 1.0000 | ORAL_TABLET | Freq: Every day | ORAL | 1 refills | Status: DC

## 2023-08-02 NOTE — Addendum Note (Signed)
Addended by: Alba Cory F on: 08/02/2023 10:09 AM   Modules accepted: Orders

## 2023-08-02 NOTE — Progress Notes (Addendum)
Name: Aron Needles   MRN: [id number]    DOB: [birth year]   Date:08/02/2023       Progress Note  Subjective  Chief Complaint  Chief Complaint  Patient presents with   Medical Management of Chronic Issues   HPI   HTN: BP was high  back in 2020 . She is now on losartan 50 mg and bp is at goal  .She denies chest pain, palpitation or dizziness.   DMII: with associated HTN, dyslipidemia and microalbuminuria . She denies polyphagia, polydipsia or polyuria .Last A1C was 8.5 % but she is now taking [place] , Metformin 1500 mg and decreased amount of rice, stopped drinking sweet tea. A1C is down to 6.6 %. She has been drinking a lot of water. Reminded her of PCV 20, yearly eye exam. We will recheck labs in July    Dyslipidemia: she is taking statin therapy but LDL not at goal , she did not want to increase dose but she has changed her diet    Vitamin D deficiency: taking centrum and wants rx vitamin D , does not like taking otc medications , we will recheck it next visit     Patient Active Problem List   Diagnosis Date Noted   Dysplasia of cervix, low grade (CIN 1) 03/13/2023   Metabolic syndrome 08/13/2018   Dyslipidemia due to type 2 diabetes mellitus (HCC) 08/13/2018   Microalbuminuria 08/13/2018   GAD (generalized anxiety disorder) 08/25/2016   Allergy to other foods 11/25/2015   H/O renal calculi 11/25/2015   Vitamin D deficiency 11/25/2015   Hyperlipidemia with low HDL 11/25/2015   Overweight (BMI 25.0-29.9) 11/04/2015   Seasonal allergies 11/04/2015   Chronic constipation 11/04/2015   Other bursal cyst, left ankle and foot 11/04/2015    Past Surgical History:  Procedure Laterality Date   AUGMENTATION MAMMAPLASTY Bilateral 07/03/2004   BREAST ENHANCEMENT SURGERY Bilateral    COLPOSCOPY  07/1995   CYSTOSCOPY  2012   DIAGNOSTIC LAPAROSCOPY  04/13/2000   for chronic RLQ pain; normal anatomy   EXTRACORPOREAL SHOCK WAVE LITHOTRIPSY     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    TUBAL LIGATION      Family History  Problem Relation Age of Onset   Cancer Mother        cervical   Cancer Maternal Uncle        Kidney   Heart attack Father    Hypertension Father    Hypercholesterolemia Father    Gout Father    Gout Brother    Breast cancer Cousin 75    Social History   Tobacco Use   Smoking status: Never   Smokeless tobacco: Never  Substance Use Topics   Alcohol use: No    Alcohol/week: 0.0 standard drinks of alcohol     Current Outpatient Medications:    dapagliflozin propanediol (FARXIGA) 10 MG TABS tablet, Take 1 tablet (10 mg total) by mouth daily., Disp: 90 tablet, Rfl: 0   losartan (COZAAR) 50 MG tablet, Take 1 tablet (50 mg total) by mouth daily., Disp: 90 tablet, Rfl: 0   metFORMIN (GLUCOPHAGE-XR) 750 MG 24 hr tablet, Take 2 tablets (1,500 mg total) by mouth daily with breakfast., Disp: 180 tablet, Rfl: 0   rosuvastatin (CRESTOR) 10 MG tablet, Take 1 tablet (10 mg total) by mouth daily., Disp: 90 tablet, Rfl: 0   Vitamin D, Ergocalciferol, (DRISDOL) 1.25 MG (50000 UNIT) CAPS capsule, Take 1 capsule (50,000 Units total) by mouth every 7 (seven) days., Disp:  12 capsule, Rfl: 1   meloxicam (MOBIC) 7.5 MG tablet, Take 7.5 mg by mouth 2 (two) times daily., Disp: , Rfl:   No Known Allergies  I personally reviewed active problem list, medication list, allergies, family history with the patient/caregiver today.   ROS  Ten systems reviewed and is negative except as mentioned in HPI    Objective  Vitals:   08/02/23 0946  BP: 120/76  Pulse: 81  Resp: 16  Temp: 97.7 F (36.5 C)  TempSrc: Oral  SpO2: 96%  Weight: 120 lb 8 oz (54.7 kg)  Height: 4\' 10"  (1.473 m)    Body mass index is 25.18 kg/m.  Physical Exam  Constitutional: Patient appears well-developed and well-nourished.  No distress.  HEENT: head atraumatic, normocephalic, pupils equal and reactive to light,, neck supple, throat within normal limits Cardiovascular: Normal rate,  regular rhythm and normal heart sounds.  No murmur heard. No BLE edema. Pulmonary/Chest: Effort normal and breath sounds normal. No respiratory distress. Abdominal: Soft.  There is no tenderness. Psychiatric: Patient has a normal mood and affect. behavior is normal. Judgment and thought content normal.   Recent Results (from the past 2160 hours)  POCT glycosylated hemoglobin (Hb A1C)     Status: Abnormal   Collection Time: 08/02/23  9:50 AM  Result Value Ref Range   Hemoglobin A1C 6.6 (A) 4.0 - 5.6 %   HbA1c POC (<> result, manual entry)     HbA1c, POC (prediabetic range)     HbA1c, POC (controlled diabetic range)      Diabetic Foot Exam:     PHQ2/9:    08/02/2023    9:40 AM 05/02/2023    9:47 AM 01/29/2023    9:29 AM 10/30/2022   11:45 AM 10/21/2021    8:39 AM  Depression screen PHQ 2/9  Decreased Interest 0 0 0 0 0  Down, Depressed, Hopeless 0 0 0 0 0  PHQ - 2 Score 0 0 0 0 0  Altered sleeping 0 0 0 0 0  Tired, decreased energy 0 0 0 0 0  Change in appetite 0 0 0 0 0  Feeling bad or failure about yourself  0 0 0 0 0  Trouble concentrating 0 0 0 0 0  Moving slowly or fidgety/restless 0 0 0 0 0  Suicidal thoughts 0 0 0 0 0  PHQ-9 Score 0 0 0 0 0  Difficult doing work/chores Not difficult at all        phq 9 is negative  Fall Risk:    08/02/2023    9:40 AM 05/02/2023    9:47 AM 01/29/2023    9:29 AM 10/30/2022   11:45 AM 10/21/2021    8:39 AM  Fall Risk   Falls in the past year? 0 1 0 0 0  Number falls in past yr: 0 0 0 0 0  Injury with Fall? 0 1 0 0 0  Risk for fall due to : No Fall Risks Orthopedic patient No Fall Risks No Fall Risks No Fall Risks  Follow up Falls prevention discussed;Education provided;Falls evaluation completed Falls prevention discussed;Education provided;Falls evaluation completed Falls prevention discussed Falls prevention discussed Falls prevention discussed     Assessment & Plan   1. Dyslipidemia due to type 2 diabetes mellitus (HCC)  (Primary)  - POCT glycosylated hemoglobin (Hb A1C) - rosuvastatin (CRESTOR) 10 MG tablet; Take 1 tablet (10 mg total) by mouth daily.  Dispense: 90 tablet; Refill: 1 - Lipid panel - Hemoglobin A1c  2. Diabetes mellitus with microalbuminuria (HCC)  - losartan (COZAAR) 50 MG tablet; Take 1 tablet (50 mg total) by mouth daily.  Dispense: 90 tablet; Refill: 1 - Dapagliflozin Pro-metFORMIN ER (XIGDUO XR) 04-999 MG TB24; Take 1 tablet by mouth daily at 12 noon.  Dispense: 90 tablet; Refill: 1 - Microalbumin / creatinine urine ratio - Hemoglobin A1c  3. Essential hypertension  - losartan (COZAAR) 50 MG tablet; Take 1 tablet (50 mg total) by mouth daily.  Dispense: 90 tablet; Refill: 1 - Comprehensive metabolic panel - CBC with Differential/Platelet  4. Vitamin D deficiency  - Vitamin D, Ergocalciferol, (DRISDOL) 1.25 MG (50000 UNIT) CAPS capsule; Take 1 capsule (50,000 Units total) by mouth every 7 (seven) days.  Dispense: 12 capsule; Refill: 1 - VITAMIN D 25 Hydroxy (Vit-D Deficiency, Fractures)  5. Long-term use of high-risk medication  - B12 and Folate Panel

## 2023-08-06 ENCOUNTER — Other Ambulatory Visit: Payer: Self-pay | Admitting: Family Medicine

## 2023-08-06 DIAGNOSIS — E1129 Type 2 diabetes mellitus with other diabetic kidney complication: Secondary | ICD-10-CM

## 2023-08-06 DIAGNOSIS — I10 Essential (primary) hypertension: Secondary | ICD-10-CM

## 2023-08-06 DIAGNOSIS — E1169 Type 2 diabetes mellitus with other specified complication: Secondary | ICD-10-CM

## 2023-08-24 ENCOUNTER — Encounter: Payer: Self-pay | Admitting: Family Medicine

## 2023-08-24 ENCOUNTER — Other Ambulatory Visit: Payer: Self-pay | Admitting: Family Medicine

## 2023-08-28 ENCOUNTER — Ambulatory Visit: Payer: Self-pay | Admitting: Family Medicine

## 2023-08-28 ENCOUNTER — Encounter: Payer: Self-pay | Admitting: Family Medicine

## 2023-08-28 ENCOUNTER — Ambulatory Visit: Payer: Managed Care, Other (non HMO) | Admitting: Family Medicine

## 2023-08-28 VITALS — BP 136/72 | HR 86 | Temp 98.8°F | Resp 16 | Ht <= 58 in | Wt 119.0 lb

## 2023-08-28 DIAGNOSIS — J329 Chronic sinusitis, unspecified: Secondary | ICD-10-CM | POA: Diagnosis not present

## 2023-08-28 DIAGNOSIS — J302 Other seasonal allergic rhinitis: Secondary | ICD-10-CM | POA: Diagnosis not present

## 2023-08-28 LAB — POCT INFLUENZA A/B
Influenza A, POC: NEGATIVE
Influenza B, POC: NEGATIVE

## 2023-08-28 MED ORDER — FLUTICASONE PROPIONATE 50 MCG/ACT NA SUSP
2.0000 | Freq: Every day | NASAL | 6 refills | Status: AC
Start: 2023-08-28 — End: ?

## 2023-08-28 MED ORDER — DOXYCYCLINE HYCLATE 100 MG PO TABS: 100.0000 mg | ORAL_TABLET | Freq: Two times a day (BID) | ORAL | 0 refills | Status: AC

## 2023-08-28 MED ORDER — LEVOCETIRIZINE DIHYDROCHLORIDE 5 MG PO TABS: 5.0000 mg | ORAL_TABLET | Freq: Every evening | ORAL | 1 refills | Status: DC

## 2023-08-28 MED ORDER — BENZONATATE 100 MG PO CAPS: 100.0000 mg | ORAL_CAPSULE | Freq: Three times a day (TID) | ORAL | 0 refills | Status: DC | PRN

## 2023-08-28 MED ORDER — PSEUDOEPHEDRINE HCL 30 MG PO TABS: 30.0000 mg | ORAL_TABLET | ORAL | 0 refills | Status: DC | PRN

## 2023-08-28 NOTE — Patient Instructions (Addendum)
 Recommend starting and doing daily a antihistamine and steroid nose spray and while you are sick a sudafed decongestant and for pain you can do tylenol and ibuprofen as needed for pain.  If you start the antibiotics later for worsening symptoms (see below the indications to start) then you have to STOP sudafed when starting antibiotics.      For worse nasal, sinus or allergy symptoms with seasonal or weather changes you may always try adjusting or starting allergy meds - zyrtec, claritin, allegra, xyzal or a combo of two is very safe. You can adjust or start nasal sprays - steroid intranasal sprays (flonase nasonex, nasocort), try saline nasal spray, or try adding an antihistamine nasal spray.   Depending on your health and vital signs, you may be able to try over-the-counter cold, cough or congestion medications such as Mucinex, Coricidin, Sudafed behind the counter, or even Tylenol or NSAIDs for minor discomfort with sinus pressure or throat irritation.  Allergies and viruses can both cause increased nasal symptoms and sinus pain and congestion, and antibiotics do not treat either of these causes of your symptoms.  Less than 2% of people with symptoms in medical studies had bacterial sinus infection needing antibiotic treatment.    Antibiotics are only indicated for acute bacterial sinusitis which is diagnosed with increased nasal or sinus symptoms that started to improve and then suddenly worsened with severe pain and pressure tenderness on exam and fever, or with prolonged symptoms after conservative management (everything described above) with moderate to severe symptoms which last longer than 10 to 12 days.  There are of course exceptions to this, including some people with immonosuppression who will need antibiotics sooner than every one else, or some people who have complicated ENT history with prior procedures and recurrent infections.    Allergic Rhinitis, Adult Allergic rhinitis is a  reaction to allergens. Allergens are things that can cause an allergic reaction. This condition affects the lining inside the nose (mucous membrane). There are two types of allergic rhinitis: Seasonal. This type is also called hay fever. It happens only during some times of the year. Perennial. This type can happen at any time of the year. This condition cannot be spread from person to person (is not contagious). It can be mild, worse, or very bad. It can develop at any age and may be outgrown. What are the causes? This condition may be caused by: Pollen from grasses, trees, and weeds. Dust mites. Smoke. Mold. Car fumes. The pee (urine), spit, or dander of pets. Dander is dead skin cells from a pet. What increases the risk? You are more likely to develop this condition if: You have allergies in your family. You have problems like allergies in your family. You may have: Swelling of parts of your eyes and eyelids. Asthma. This affects how you breathe. Long-term redness and swelling on your skin. Food allergies. What are the signs or symptoms? The main symptom of this condition is a runny or stuffy nose (nasal congestion). Other symptoms may include: Sneezing or coughing. Itching and tearing of your eyes. Mucus that drips down the back of your throat (postnasal drip). Trouble sleeping. Feeling tired. Headache. Sore throat. How is this treated? There is no cure for this condition. You should avoid things that you are allergic to. Treatment can help to relieve symptoms. This may include: Medicines that block allergy symptoms, such as corticosteroids or antihistamines. These may be given as a shot, nasal spray, or pill. Avoiding things you are  allergic to. Medicines that give you bits of what you are allergic to over time. This is called immunotherapy. It is done if other treatments do not help. You may get: Shots. Medicine under your tongue. Stronger medicines, if other treatments do  not help. Follow these instructions at home: Avoiding allergens Find out what things you are allergic to and avoid them. To do this, try these things: If you get allergies any time of year: Replace carpet with wood, tile, or vinyl flooring. Carpet can trap pet dander and dust. Do not smoke. Do not allow smoking in your home. Change your heating and air conditioning filters at least once a month. If you get allergies only some times of the year: Keep windows closed when you can. Plan things to do outside when pollen counts are lowest. Check pollen counts before you plan things to do outside. When you come indoors, change your clothes and shower before you sit on furniture or bedding. If you are allergic to a pet: Keep the pet out of your bedroom. Vacuum, sweep, and dust often.  General instructions Take over-the-counter and prescription medicines only as told by your doctor. Drink enough fluid to keep your pee (urine) pale yellow. Keep all follow-up visits as told by your doctor. This is important. Where to find more information American Academy of Allergy, Asthma & Immunology: www.aaaai.org Contact a doctor if: You have a fever. You get a cough that does not go away. You make whistling sounds when you breathe (wheeze). Your symptoms slow you down. Your symptoms stop you from doing your normal things each day. Get help right away if: You are short of breath. This symptom may be an emergency. Do not wait to see if the symptom will go away. Get medical help right away. Call your local emergency services (911 in the [place] Do not drive yourself to the hospital. Summary Allergic rhinitis may be treated by taking medicines and avoiding things you are allergic to. If you have allergies only some of the year, keep windows closed when you can at those times. Contact your doctor if you get a fever or a cough that does not go away. This information is not intended to replace advice given to  you by your health care provider. Make sure you discuss any questions you have with your health care provider. Document Revised: 08/11/2019 Document Reviewed: 06/17/2019 Elsevier Patient Education  2023 Elsevier Inc.  Sinus Infection, Adult A sinus infection is soreness and swelling (inflammation) of your sinuses. Sinuses are hollow spaces in the bones around your face. They are located: Around your eyes. In the middle of your forehead. Behind your nose. In your cheekbones. Your sinuses and nasal passages are lined with a fluid called mucus. Mucus drains out of your sinuses. Swelling can trap mucus in your sinuses. This lets germs (bacteria, virus, or fungus) grow, which leads to infection. Most of the time, this condition is caused by a virus. What are the causes? Allergies. Asthma. Germs. Things that block your nose or sinuses. Growths in the nose (nasal polyps). Chemicals or irritants in the air. A fungus. This is rare. What increases the risk? Having a weak body defense system (immune system). Doing a lot of swimming or diving. Using nasal sprays too much. Smoking. What are the signs or symptoms? The main symptoms of this condition are pain and a feeling of pressure around the sinuses. Other symptoms include: Stuffy nose (congestion). This may make it hard to breathe through your  nose. Runny nose (drainage). Soreness, swelling, and warmth in the sinuses. A cough that may get worse at night. Being unable to smell and taste. Mucus that collects in the throat or the back of the nose (postnasal drip). This may cause a sore throat or bad breath. Being very tired (fatigued). A fever. How is this diagnosed? Your symptoms. Your medical history. A physical exam. Tests to find out if your condition is short-term (acute) or long-term (chronic). Your doctor may: Check your nose for growths (polyps). Check your sinuses using a tool that has a light on one end (endoscope). Check for  allergies or germs. Do imaging tests, such as an MRI or CT scan. How is this treated? Treatment for this condition depends on the cause and whether it is short-term or long-term. If caused by a virus, your symptoms should go away on their own within 10 days. You may be given medicines to relieve symptoms. They include: Medicines that shrink swollen tissue in the nose. A spray that treats swelling of the nostrils. Rinses that help get rid of thick mucus in your nose (nasal saline washes). Medicines that treat allergies (antihistamines). Over-the-counter pain relievers. If caused by bacteria, your doctor may wait to see if you will get better without treatment. You may be given antibiotic medicine if you have: A very bad infection. A weak body defense system. If caused by growths in the nose, surgery may be needed. Follow these instructions at home: Medicines Take, use, or apply over-the-counter and prescription medicines only as told by your doctor. These may include nasal sprays. If you were prescribed an antibiotic medicine, take it as told by your doctor. Do not stop taking it even if you start to feel better. Hydrate and humidify  Drink enough water to keep your pee (urine) pale yellow. Use a cool mist humidifier to keep the humidity level in your home above 50%. Breathe in steam for 10-15 minutes, 3-4 times a day, or as told by your doctor. You can do this in the bathroom while a hot shower is running. Try not to spend time in cool or dry air. Rest Rest as much as you can. Sleep with your head raised (elevated). Make sure you get enough sleep each night. General instructions  Put a warm, moist washcloth on your face 3-4 times a day, or as often as told by your doctor. Use nasal saline washes as often as told by your doctor. Wash your hands often with soap and water. If you cannot use soap and water, use hand sanitizer. Do not smoke. Avoid being around people who are smoking  (secondhand smoke). Keep all follow-up visits. Contact a doctor if: You have a fever. Your symptoms get worse. Your symptoms do not get better within 10 days. Get help right away if: You have a very bad headache. You cannot stop vomiting. You have very bad pain or swelling around your face or eyes. You have trouble seeing. You feel confused. Your neck is stiff. You have trouble breathing. These symptoms may be an emergency. Get help right away. Call 911. Do not wait to see if the symptoms will go away. Do not drive yourself to the hospital. Summary A sinus infection is swelling of your sinuses. Sinuses are hollow spaces in the bones around your face. This condition is caused by tissues in your nose that become inflamed or swollen. This traps germs. These can lead to infection. If you were prescribed an antibiotic medicine, take it as  told by your doctor. Do not stop taking it even if you start to feel better. Keep all follow-up visits. This information is not intended to replace advice given to you by your health care provider. Make sure you discuss any questions you have with your health care provider. Document Revised: 05/24/2021 Document Reviewed: 05/24/2021 Elsevier Patient Education  2023 [employer].

## 2023-08-28 NOTE — Telephone Encounter (Signed)
 Copied from CRM [id number]. Topic: Clinical - Medical Advice >> Aug 28, 2023  9:59 AM Kathleen Villanueva wrote: Reason for CRM: Patient has congestion and sinus pressure. Symptoms started sunday evening, no fevers today. Patient taking mucinex  (this morning) and theraflu (last night).  Patient also states she has clear runny, mucus.   Patient inquiring if something could be sent in or recommend something OTC. Patient okay to schedule appointment if needed, [phone]   Chief Complaint: Sinus pressure/pain Symptoms: Runny nose with clear phlegm, Nasal/sinus congestion around nose and eyes Frequency: 2 days ago Pertinent Negatives: Patient denies cough, sore throat, vomiting, difficulty breathing, chest pain Disposition: [] ED /[] Urgent Care (no appt availability in office) / [x] Appointment(In office/virtual)/ []  [facility]/ [] Home Care/ [] Refused Recommended Disposition /[] [facility]/ []  Follow-up with PCP Additional Notes: Patient called and advised that for the past two days she has been having sinus pressure/pain around her nose, eyes, and even her top teeth.  She states that she gets like this every year with the weather change. Patient took Theraflu last night and she has had some chills last night.  She isn't sure if she had a fever or not. She denies any difficulty breathing, chest pain, sore throat, cough, or vomiting. She states that she has been drinking water with honey and lemon in it today and she also took some Mucinex. She states that she is still having sinus pressure. Patient states that she is going out of town this weekend and wants to see if she needs any medications prescriptions or over the counter. She states that she has a lot of pressure in her ears as well. Patient given Care Advice as per protocol and an appointment is made for today 08/28/2023 at 3:20 pm with Danelle Berry at patient's PCP office. Patient is also advised that if she has any questions she can  call us back and if anything worsens she can go to the emergency room.  Patient verbalized understanding.   Reason for Disposition  Earache  Answer Assessment - Initial Assessment Questions 1. LOCATION: "Where does it hurt?"      Around eyes, nose, top teeth 2. ONSET: "When did the sinus pain start?"  (e.g., hours, days)      2 days ago 3. SEVERITY: "How bad is the pain?"   (Scale 1-10; mild, moderate or severe)   - MILD (1-3): doesn't interfere with normal activities    - MODERATE (4-7): interferes with normal activities (e.g., work or school) or awakens from sleep   - SEVERE (8-10): excruciating pain and patient unable to do any normal activities        6-7 4. RECURRENT SYMPTOM: "Have you ever had sinus problems before?" If Yes, ask: "When was the last time?" and "What happened that time?"      "Every year sinus issues" 5. NASAL CONGESTION: "Is the nose blocked?" If Yes, ask: "Can you open it or must you breathe through your mouth?"     "Yes I can still blow my nose and I've been keeping hydrated" 6. NASAL DISCHARGE: "Do you have discharge from your nose?" If so ask, "What color?"     Clear--not running as bad 7. FEVER: "Do you have a fever?" If Yes, ask: "What is it, how was it measured, and when did it start?"      unknown 8. OTHER SYMPTOMS: "Do you have any other symptoms?" (e.g., sore throat, cough, earache, difficulty breathing)     Ear pressure  Protocols used: Sinus Pain or Congestion-A-AH

## 2023-08-28 NOTE — Progress Notes (Signed)
 Villanueva ID: Kathleen Villanueva, female    DOB: [birth year], 53 y.o.   MRN: [id number]  PCP: Kathleen Cory, MD  Chief Complaint  Villanueva presents with   Sinusitis    X2 days. OTC meds not helping.   Nasal Congestion    Subjective:   Kathleen Villanueva is a 53 y.o. female, presents to clinic with CC of the following:  HPI  Here with 2 days of URI sx Her husband got sick as well She had runny nose but also sinus congestion and pressure with hx of allergic rhinitis, not currently on any meds No fever, body aches, sore throat, SOB Some mild nonproductive cough She is using mucinex She gets this usually 1-2 x a year and normally waits it out but she does have plans this weekend to travel and she wanted to be seen today to know what to do to help manage sx  Villanueva Active Problem List   Diagnosis Date Noted   Dysplasia of cervix, low grade (CIN 1) 03/13/2023   Metabolic syndrome 08/13/2018   Dyslipidemia due to type 2 diabetes mellitus (HCC) 08/13/2018   Microalbuminuria 08/13/2018   GAD (generalized anxiety disorder) 08/25/2016   Allergy to other foods 11/25/2015   H/O renal calculi 11/25/2015   Vitamin D deficiency 11/25/2015   Hyperlipidemia with low HDL 11/25/2015   Overweight (BMI 25.0-29.9) 11/04/2015   Seasonal allergies 11/04/2015   Chronic constipation 11/04/2015   Other bursal cyst, left ankle and foot 11/04/2015      Current Outpatient Medications:    Dapagliflozin Pro-metFORMIN ER (XIGDUO XR) 04-999 MG TB24, Take 1 tablet by mouth daily at 12 noon., Disp: 90 tablet, Rfl: 1   losartan (COZAAR) 50 MG tablet, Take 1 tablet (50 mg total) by mouth daily., Disp: 90 tablet, Rfl: 1   rosuvastatin (CRESTOR) 10 MG tablet, Take 1 tablet (10 mg total) by mouth daily., Disp: 90 tablet, Rfl: 1   Vitamin D, Ergocalciferol, (DRISDOL) 1.25 MG (50000 UNIT) CAPS capsule, Take 1 capsule (50,000 Units total) by mouth every 7 (seven) days., Disp: 12 capsule, Rfl: 1   No Known  Allergies   Social History   Tobacco Use   Smoking status: Never   Smokeless tobacco: Never  Vaping Use   Vaping status: Never Used  Substance Use Topics   Alcohol use: No    Alcohol/week: 0.0 standard drinks of alcohol   Drug use: No      Chart Review Today: I personally reviewed active problem list, medication list, allergies, family history, social history, health maintenance, notes from last encounter, lab results, imaging with the Villanueva/caregiver today.   Review of Systems  Constitutional: Negative.   HENT: Negative.    Eyes: Negative.   Respiratory: Negative.    Cardiovascular: Negative.   Gastrointestinal: Negative.   Endocrine: Negative.   Genitourinary: Negative.   Musculoskeletal: Negative.   Skin: Negative.   Allergic/Immunologic: Negative.   Neurological: Negative.   Hematological: Negative.   Psychiatric/Behavioral: Negative.    All other systems reviewed and are negative.      Objective:   Vitals:   08/28/23 1521  BP: 136/72  Pulse: 86  Resp: 16  Temp: 98.8 F (37.1 C)  TempSrc: Oral  SpO2: 97%  Weight: 119 lb (54 kg)  Height: 4\' 10"  (1.473 m)    Body mass index is 24.87 kg/m.  Physical Exam Vitals and nursing note reviewed.  Constitutional:      General: She is not in acute distress.  Appearance: Normal appearance. She is well-developed. She is not ill-appearing, toxic-appearing or diaphoretic.  HENT:     Head: Normocephalic and atraumatic.     Right Ear: Tympanic membrane, ear canal and external ear normal. There is no impacted cerumen.     Left Ear: Tympanic membrane, ear canal and external ear normal. There is no impacted cerumen.     Nose: Mucosal edema, congestion and rhinorrhea present. Rhinorrhea is clear.     Right Nostril: No epistaxis or occlusion.     Left Nostril: No epistaxis or occlusion.     Right Turbinates: Swollen.     Left Turbinates: Swollen.     Right Sinus: No maxillary sinus tenderness or frontal sinus  tenderness.     Left Sinus: No maxillary sinus tenderness or frontal sinus tenderness.     Mouth/Throat:     Mouth: Mucous membranes are moist.     Pharynx: Oropharynx is clear. Posterior oropharyngeal erythema (mild) present. No oropharyngeal exudate.  Eyes:     General: No scleral icterus.       Right eye: No discharge.        Left eye: No discharge.     Conjunctiva/sclera: Conjunctivae normal.  Neck:     Trachea: No tracheal deviation.  Cardiovascular:     Rate and Rhythm: Normal rate and regular rhythm.     Pulses: Normal pulses.     Heart sounds: Normal heart sounds.  Pulmonary:     Effort: Pulmonary effort is normal. No respiratory distress.     Breath sounds: Normal breath sounds. No stridor.  Musculoskeletal:        General: Normal range of motion.     Cervical back: Normal range of motion.  Lymphadenopathy:     Cervical: No cervical adenopathy.  Skin:    General: Skin is warm and dry.     Findings: No rash.  Neurological:     Mental Status: She is alert.     Motor: No abnormal muscle tone.     Coordination: Coordination normal.  Psychiatric:        Behavior: Behavior normal.      Results for orders placed or performed in visit on 08/28/23  POCT Influenza A/B   Collection Time: 08/28/23  3:57 PM  Result Value Ref Range   Influenza A, POC Negative Negative   Influenza B, POC Negative Negative       Assessment & Plan:   1. Seasonal allergies (Primary) Start meds for sx and prevention with weather changes and worse sx in spring - fluticasone (FLONASE) 50 MCG/ACT nasal spray; Place 2 sprays into both nostrils daily.  Dispense: 16 g; Refill: 6 - levocetirizine (XYZAL) 5 MG tablet; Take 1 tablet (5 mg total) by mouth every evening.  Dispense: 90 tablet; Refill: 1  2. Rhinosinusitis Likely URI viral illness, encouraged supportive and symptomatic measures Reviewed with pt that today I do not think abx are indicated  She should use an antihistamine, steroid no  sprays, saline nose sprays, decongestants, she can continue Mucinex or Mucinex DM, she can try Tessalon Encouraged her to try these measures for the next several days to week and antibiotic was prescribed to put on hold in case she has acutely worsening symptoms or symptoms lasting 10 to 12 days or longer. In the past she has not needed antibiotics.  Hopeful she will not need them because most likely she has a viral illness that will run its course  - fluticasone (FLONASE) 50 MCG/ACT nasal  spray; Place 2 sprays into both nostrils daily.  Dispense: 16 g; Refill: 6 - levocetirizine (XYZAL) 5 MG tablet; Take 1 tablet (5 mg total) by mouth every evening.  Dispense: 90 tablet; Refill: 1 - pseudoephedrine (SUDAFED) 30 MG tablet; Take 1 tablet (30 mg total) by mouth every 4 (four) hours as needed for congestion.  Dispense: 30 tablet; Refill: 0 - benzonatate (TESSALON) 100 MG capsule; Take 1 capsule (100 mg total) by mouth 3 (three) times daily as needed for cough.  Dispense: 30 capsule; Refill: 0 - POCT Influenza A/B - NEG today  Extensive instructions reviewed with her on printed AVS    Danelle Berry, PA-C 08/28/23 3:35 PM

## 2023-09-03 ENCOUNTER — Ambulatory Visit
Admission: RE | Admit: 2023-09-03 | Discharge: 2023-09-03 | Disposition: A | Discharge status: Home / Self Care | Payer: Managed Care, Other (non HMO) | Source: Ambulatory Visit | Attending: Obstetrics and Gynecology | Admitting: Obstetrics and Gynecology

## 2023-09-03 DIAGNOSIS — Z1231 Encounter for screening mammogram for malignant neoplasm of breast: Secondary | ICD-10-CM | POA: Diagnosis present

## 2023-09-05 ENCOUNTER — Encounter: Payer: Self-pay | Admitting: Obstetrics and Gynecology

## 2023-10-23 ENCOUNTER — Telehealth: Payer: Self-pay | Admitting: Family Medicine

## 2023-10-23 NOTE — Telephone Encounter (Signed)
 Left vm w/ phamracy

## 2023-10-23 NOTE — Telephone Encounter (Signed)
 Copied from CRM [id number]. Topic: Clinical - Medication Refill >> Oct 23, 2023  9:25 AM Virgil Griffiths wrote: Most Recent Primary Care Visit:  Provider: Adeline Hone  Department: [facility]  Visit Type: ACUTE  Date: 08/28/2023  Medication: Dapagliflozin  10mg  tab (farxiga )  Has the patient contacted their pharmacy? Yes (Agent: If no, request that the patient contact the pharmacy for the refill. If patient does not wish to contact the pharmacy document the reason why and proceed with request.) (Agent: If yes, when and what did the pharmacy advise?)  Is this the correct pharmacy for this prescription? Yes If no, delete pharmacy and type the correct one.  This is the patient's preferred pharmacy:  [id number] - [place], [place] - [address] AT [facility] OF [address] [place] [address] Phone: [phone] Fax: [phone]  Has the prescription been filled recently? Yes  Is the patient out of the medication? No  Has the patient been seen for an appointment in the last year OR does the patient have an upcoming appointment? Yes  Can we respond through MyChart? Yes  Agent: Please be advised that Rx refills may take up to 3 business days. We ask that you follow-up with your pharmacy.

## 2023-12-17 ENCOUNTER — Encounter: Payer: Self-pay | Admitting: Family Medicine

## 2023-12-17 ENCOUNTER — Other Ambulatory Visit: Payer: Self-pay | Admitting: Family Medicine

## 2023-12-17 MED ORDER — DAPAGLIFLOZIN PROPANEDIOL 10 MG PO TABS
10.0000 mg | ORAL_TABLET | Freq: Every day | ORAL | 0 refills | Status: DC
Start: 2023-12-17 — End: 2024-01-28

## 2023-12-17 MED ORDER — METFORMIN HCL ER 750 MG PO TB24: 1500.0000 mg | ORAL_TABLET | Freq: Every day | ORAL | 0 refills | Status: DC

## 2024-01-22 ENCOUNTER — Ambulatory Visit: Payer: Self-pay | Admitting: Family Medicine

## 2024-01-22 LAB — CBC WITH DIFFERENTIAL/PLATELET
Basophils Absolute: 0 x10E3/uL (ref 0.0–0.2)
Basos: 1 %
EOS (ABSOLUTE): 0.2 x10E3/uL (ref 0.0–0.4)
Eos: 3 %
Hematocrit: 47.5 % — ABNORMAL HIGH (ref 34.0–46.6)
Hemoglobin: 15.4 g/dL (ref 11.1–15.9)
Immature Grans (Abs): 0 x10E3/uL (ref 0.0–0.1)
Immature Granulocytes: 0 %
Lymphocytes Absolute: 3.2 x10E3/uL — ABNORMAL HIGH (ref 0.7–3.1)
Lymphs: 47 %
MCH: 28.4 pg (ref 26.6–33.0)
MCHC: 32.4 g/dL (ref 31.5–35.7)
MCV: 88 fL (ref 79–97)
Monocytes Absolute: 0.5 x10E3/uL (ref 0.1–0.9)
Monocytes: 7 %
Neutrophils Absolute: 2.8 x10E3/uL (ref 1.4–7.0)
Neutrophils: 42 %
Platelets: 217 x10E3/uL (ref 150–450)
RBC: 5.42 x10E6/uL — ABNORMAL HIGH (ref 3.77–5.28)
RDW: 13.7 % (ref 11.7–15.4)
WBC: 6.7 x10E3/uL (ref 3.4–10.8)

## 2024-01-22 LAB — COMPREHENSIVE METABOLIC PANEL WITH GFR
ALT: 24 IU/L (ref 0–32)
AST: 20 IU/L (ref 0–40)
Albumin: 4.6 g/dL (ref 3.8–4.9)
Alkaline Phosphatase: 45 IU/L (ref 44–121)
BUN/Creatinine Ratio: 19 (ref 9–23)
BUN: 17 mg/dL (ref 6–24)
Bilirubin Total: 0.6 mg/dL (ref 0.0–1.2)
CO2: 21 mmol/L (ref 20–29)
Calcium: 9.5 mg/dL (ref 8.7–10.2)
Chloride: 103 mmol/L (ref 96–106)
Creatinine, Ser: 0.88 mg/dL (ref 0.57–1.00)
Globulin, Total: 3.6 g/dL (ref 1.5–4.5)
Glucose: 134 mg/dL — ABNORMAL HIGH (ref 70–99)
Potassium: 4.7 mmol/L (ref 3.5–5.2)
Sodium: 138 mmol/L (ref 134–144)
Total Protein: 8.2 g/dL (ref 6.0–8.5)
eGFR: 80 mL/min/1.73 (ref >59)

## 2024-01-22 LAB — B12 AND FOLATE PANEL
Folate: 11.3 ng/mL (ref >3.0)
Vitamin B-12: 698 pg/mL (ref 232–1245)

## 2024-01-22 LAB — LIPID PANEL
Chol/HDL Ratio: 6.4 ratio — ABNORMAL HIGH (ref 0.0–4.4)
Cholesterol, Total: 229 mg/dL — ABNORMAL HIGH (ref 100–199)
HDL: 36 mg/dL — ABNORMAL LOW (ref >39)
LDL Chol Calc (NIH): 146 mg/dL — ABNORMAL HIGH (ref 0–99)
Triglycerides: 254 mg/dL — ABNORMAL HIGH (ref 0–149)
VLDL Cholesterol Cal: 47 mg/dL — ABNORMAL HIGH (ref 5–40)

## 2024-01-22 LAB — MICROALBUMIN / CREATININE URINE RATIO
Creatinine, Urine: 65.7 mg/dL
Microalb/Creat Ratio: 176 mg/g{creat} — ABNORMAL HIGH (ref 0–29)
Microalbumin, Urine: 115.8 ug/mL

## 2024-01-22 LAB — VITAMIN D 25 HYDROXY (VIT D DEFICIENCY, FRACTURES): Vit D, 25-Hydroxy: 28.2 ng/mL — ABNORMAL LOW (ref 30.0–100.0)

## 2024-01-22 LAB — HEMOGLOBIN A1C
Est. average glucose Bld gHb Est-mCnc: 160 mg/dL
Hgb A1c MFr Bld: 7.2 % — ABNORMAL HIGH (ref 4.8–5.6)

## 2024-01-23 ENCOUNTER — Other Ambulatory Visit: Payer: Self-pay | Admitting: Family Medicine

## 2024-01-23 DIAGNOSIS — E1129 Type 2 diabetes mellitus with other diabetic kidney complication: Secondary | ICD-10-CM

## 2024-01-23 DIAGNOSIS — I10 Essential (primary) hypertension: Secondary | ICD-10-CM

## 2024-01-28 ENCOUNTER — Ambulatory Visit: Payer: Self-pay | Admitting: Family Medicine

## 2024-01-28 ENCOUNTER — Encounter: Payer: Self-pay | Admitting: Family Medicine

## 2024-01-28 VITALS — BP 132/82 | HR 78 | Resp 16 | Ht <= 58 in | Wt 121.0 lb

## 2024-01-28 DIAGNOSIS — R0683 Snoring: Secondary | ICD-10-CM

## 2024-01-28 DIAGNOSIS — E1129 Type 2 diabetes mellitus with other diabetic kidney complication: Secondary | ICD-10-CM

## 2024-01-28 DIAGNOSIS — I10 Essential (primary) hypertension: Secondary | ICD-10-CM

## 2024-01-28 DIAGNOSIS — E785 Hyperlipidemia, unspecified: Secondary | ICD-10-CM

## 2024-01-28 DIAGNOSIS — J302 Other seasonal allergic rhinitis: Secondary | ICD-10-CM

## 2024-01-28 DIAGNOSIS — Z7984 Long term (current) use of oral hypoglycemic drugs: Secondary | ICD-10-CM

## 2024-01-28 DIAGNOSIS — Z1211 Encounter for screening for malignant neoplasm of colon: Secondary | ICD-10-CM

## 2024-01-28 DIAGNOSIS — E1169 Type 2 diabetes mellitus with other specified complication: Secondary | ICD-10-CM | POA: Diagnosis not present

## 2024-01-28 DIAGNOSIS — E559 Vitamin D deficiency, unspecified: Secondary | ICD-10-CM

## 2024-01-28 DIAGNOSIS — R809 Proteinuria, unspecified: Secondary | ICD-10-CM

## 2024-01-28 MED ORDER — METFORMIN HCL ER 750 MG PO TB24: 1500.0000 mg | ORAL_TABLET | Freq: Every day | ORAL | 0 refills | Status: DC

## 2024-01-28 MED ORDER — LOSARTAN POTASSIUM 50 MG PO TABS: 50.0000 mg | ORAL_TABLET | Freq: Every day | ORAL | 1 refills | Status: DC

## 2024-01-28 MED ORDER — LEVOCETIRIZINE DIHYDROCHLORIDE 5 MG PO TABS: 5.0000 mg | ORAL_TABLET | Freq: Every evening | ORAL | 1 refills | Status: AC

## 2024-01-28 MED ORDER — AZELASTINE HCL 0.1 % NA SOLN: 2.0000 | Freq: Two times a day (BID) | NASAL | 2 refills | Status: AC

## 2024-01-28 MED ORDER — DAPAGLIFLOZIN PROPANEDIOL 10 MG PO TABS
10.0000 mg | ORAL_TABLET | Freq: Every day | ORAL | 0 refills | Status: DC
Start: 2024-01-28 — End: 2024-05-01

## 2024-01-28 NOTE — Progress Notes (Signed)
 Name: Kathleen Villanueva   MRN: [id number]    DOB: [birth year]   Date:01/28/2024       Progress Note  Subjective  Chief Complaint  Chief Complaint  Patient presents with   Medical Management of Chronic Issues   Discussed the use of AI scribe software for clinical note transcription with the patient, who gave verbal consent to proceed.  History of Present Illness Kathleen Villanueva is a 53 year old female with type 2 diabetes and hypertension who presents for a follow-up visit after recent blood work.  Her recent blood work showed an increase in A1c from 6.6% in July to 7.2%. She was previously on Xigduo  but switched to Farxiga  10 mg and metformin  750 mg daily due to heartburn. There was a gap in medication earlier this year due to cost issues with Xigduo . No symptoms of uncontrolled diabetes such as excessive hunger or thirst. She is managing her diet by avoiding sweet tea and drinking water with lemon and sweetener.  For hypertension, her home blood pressure readings are generally lower than those taken in the office, often due to anxiety during visits. She continues to take losartan  for blood pressure management.  She has dyslipidemia associated with her diabetes. She is not currently taking rosuvastatin , which may have contributed to her elevated cholesterol levels. Recent cholesterol panel showed LDL levels ranging from 123 to 154 mg/dL and low HDL at 36 mg/dL.  She reports a history of snoring, which she attributes to allergies. She uses Sudafed for allergy relief but is advised against it due to her hypertension. She has not been diagnosed with sleep apnea and does not experience excessive daytime sleepiness.  She has type 2 diabetes with microalbuminuria, with recent urine protein levels decreasing from 391 to 176 mg/dL. She continues to take Farxiga  and losartan  and denies side effects    Patient Active Problem List   Diagnosis Date Noted   Dysplasia of cervix, low grade (CIN 1)  03/13/2023   Metabolic syndrome 08/13/2018   Dyslipidemia due to type 2 diabetes mellitus (HCC) 08/13/2018   Microalbuminuria 08/13/2018   GAD (generalized anxiety disorder) 08/25/2016   Allergy to other foods 11/25/2015   H/O renal calculi 11/25/2015   Vitamin D  deficiency 11/25/2015   Hyperlipidemia with low HDL 11/25/2015   Overweight (BMI 25.0-29.9) 11/04/2015   Seasonal allergies 11/04/2015   Chronic constipation 11/04/2015   Other bursal cyst, left ankle and foot 11/04/2015    Past Surgical History:  Procedure Laterality Date   AUGMENTATION MAMMAPLASTY Bilateral 07/03/2004   BREAST ENHANCEMENT SURGERY Bilateral    COLPOSCOPY  07/1995   CYSTOSCOPY  2012   DIAGNOSTIC LAPAROSCOPY  04/13/2000   for chronic RLQ pain; normal anatomy   EXTRACORPOREAL SHOCK WAVE LITHOTRIPSY     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   TUBAL LIGATION      Family History  Problem Relation Age of Onset   Cancer Mother        cervical   Cancer Maternal Uncle        Kidney   Heart attack Father    Hypertension Father    Hypercholesterolemia Father    Gout Father    Gout Brother    Breast cancer Cousin 43    Social History   Tobacco Use   Smoking status: Never   Smokeless tobacco: Never  Substance Use Topics   Alcohol use: No    Alcohol/week: 0.0 standard drinks of alcohol     Current Outpatient Medications:  azelastine  (ASTELIN ) 0.1 % nasal spray, Place 2 sprays into both nostrils 2 (two) times daily. Use in each nostril as directed, Disp: 30 mL, Rfl: 2   fluticasone  (FLONASE ) 50 MCG/ACT nasal spray, Place 2 sprays into both nostrils daily., Disp: 16 g, Rfl: 6   rosuvastatin  (CRESTOR ) 10 MG tablet, Take 1 tablet (10 mg total) by mouth daily., Disp: 90 tablet, Rfl: 1   Vitamin D , Ergocalciferol , (DRISDOL ) 1.25 MG (50000 UNIT) CAPS capsule, Take 1 capsule (50,000 Units total) by mouth every 7 (seven) days., Disp: 12 capsule, Rfl: 1   dapagliflozin  propanediol (FARXIGA ) 10 MG TABS  tablet, Take 1 tablet (10 mg total) by mouth daily before breakfast., Disp: 90 tablet, Rfl: 0   levocetirizine (XYZAL ) 5 MG tablet, Take 1 tablet (5 mg total) by mouth every evening., Disp: 90 tablet, Rfl: 1   losartan  (COZAAR ) 50 MG tablet, Take 1 tablet (50 mg total) by mouth daily., Disp: 90 tablet, Rfl: 1   metFORMIN  (GLUCOPHAGE -XR) 750 MG 24 hr tablet, Take 2 tablets (1,500 mg total) by mouth daily with breakfast., Disp: 180 tablet, Rfl: 0  No Known Allergies  I personally reviewed active problem list, medication list, allergies with the patient/caregiver today.   ROS  Ten systems reviewed and is negative except as mentioned in HPI    Objective Physical Exam VITALS: BP- 132/82 CONSTITUTIONAL: Patient appears well-developed and well-nourished.  No distress. HEENT: Head atraumatic, normocephalic, neck supple. CARDIOVASCULAR: Normal rate, regular rhythm and normal heart sounds.  No murmur heard. No BLE edema. PULMONARY: Effort normal and breath sounds normal. No respiratory distress. ABDOMINAL: There is no tenderness or distention. MUSCULOSKELETAL: Normal gait. Without gross motor or sensory deficit. PSYCHIATRIC: Patient has a normal mood and affect. behavior is normal. Judgment and thought content normal.  Vitals:   01/28/24 0916  BP: 132/82  Pulse: 78  Resp: 16  SpO2: 99%  Weight: 121 lb (54.9 kg)  Height: 4' 10 (1.473 m)    Body mass index is 25.29 kg/m.  Recent Results (from the past 2160 hours)  Lipid panel     Status: Abnormal   Collection Time: 01/21/24  8:58 AM  Result Value Ref Range   Cholesterol, Total 229 (H) 100 - 199 mg/dL   Triglycerides 745 (H) 0 - 149 mg/dL   HDL 36 (L) >60 mg/dL   VLDL Cholesterol Cal 47 (H) 5 - 40 mg/dL   LDL Chol Calc (NIH) 853 (H) 0 - 99 mg/dL   Chol/HDL Ratio 6.4 (H) 0.0 - 4.4 ratio    Comment:                                   T. Chol/HDL Ratio                                             Men  Women                                1/2 Avg.Risk  3.4    3.3                                   Avg.Risk  5.0  4.4                                2X Avg.Risk  9.6    7.1                                3X Avg.Risk 23.4   11.0   Microalbumin / creatinine urine ratio     Status: Abnormal   Collection Time: 01/21/24  8:58 AM  Result Value Ref Range   Creatinine, Urine 65.7 Not Estab. mg/dL   Microalbumin, Urine 884.1 Not Estab. ug/mL   Microalb/Creat Ratio 176 (H) 0 - 29 mg/g creat    Comment:                        Normal:                0 -  29                        Moderately increased: 30 - 300                        Severely increased:       >300   Comprehensive metabolic panel     Status: Abnormal   Collection Time: 01/21/24  8:58 AM  Result Value Ref Range   Glucose 134 (H) 70 - 99 mg/dL   BUN 17 6 - 24 mg/dL   Creatinine, Ser 9.11 0.57 - 1.00 mg/dL   eGFR 80 >40 fO/fpw/8.26   BUN/Creatinine Ratio 19 9 - 23   Sodium 138 134 - 144 mmol/L   Potassium 4.7 3.5 - 5.2 mmol/L   Chloride 103 96 - 106 mmol/L   CO2 21 20 - 29 mmol/L   Calcium  9.5 8.7 - 10.2 mg/dL   Total Protein 8.2 6.0 - 8.5 g/dL   Albumin 4.6 3.8 - 4.9 g/dL   Globulin, Total 3.6 1.5 - 4.5 g/dL   Bilirubin Total 0.6 0.0 - 1.2 mg/dL   Alkaline Phosphatase 45 44 - 121 IU/L   AST 20 0 - 40 IU/L   ALT 24 0 - 32 IU/L  CBC with Differential/Platelet     Status: Abnormal   Collection Time: 01/21/24  8:58 AM  Result Value Ref Range   WBC 6.7 3.4 - 10.8 x10E3/uL   RBC 5.42 (H) 3.77 - 5.28 x10E6/uL   Hemoglobin 15.4 11.1 - 15.9 g/dL   Hematocrit 52.4 (H) 65.9 - 46.6 %   MCV 88 79 - 97 fL   MCH 28.4 26.6 - 33.0 pg   MCHC 32.4 31.5 - 35.7 g/dL   RDW 86.2 88.2 - 84.5 %   Platelets 217 150 - 450 x10E3/uL   Neutrophils 42 Not Estab. %   Lymphs 47 Not Estab. %   Monocytes 7 Not Estab. %   Eos 3 Not Estab. %   Basos 1 Not Estab. %   Neutrophils Absolute 2.8 1.4 - 7.0 x10E3/uL   Lymphocytes Absolute 3.2 (H) 0.7 - 3.1 x10E3/uL   Monocytes Absolute 0.5  0.1 - 0.9 x10E3/uL   EOS (ABSOLUTE) 0.2 0.0 - 0.4 x10E3/uL   Basophils Absolute 0.0 0.0 - 0.2 x10E3/uL   Immature Granulocytes 0 Not Estab. %   Immature Grans (Abs) 0.0 0.0 - 0.1 x10E3/uL  Hemoglobin  A1c     Status: Abnormal   Collection Time: 01/21/24  8:58 AM  Result Value Ref Range   Hgb A1c MFr Bld 7.2 (H) 4.8 - 5.6 %    Comment:          Prediabetes: 5.7 - 6.4          Diabetes: >6.4          Glycemic control for adults with diabetes: <7.0    Est. average glucose Bld gHb Est-mCnc 160 mg/dL  VITAMIN D  25 Hydroxy (Vit-D Deficiency, Fractures)     Status: Abnormal   Collection Time: 01/21/24  8:58 AM  Result Value Ref Range   Vit D, 25-Hydroxy 28.2 (L) 30.0 - 100.0 ng/mL    Comment: Vitamin D  deficiency has been defined by the Institute of Medicine and an Endocrine Society practice guideline as a level of serum 25-OH vitamin D  less than 20 ng/mL (1,2). The Endocrine Society went on to further define vitamin D  insufficiency as a level between 21 and 29 ng/mL (2). 1. IOM (Institute of Medicine). 2010. Dietary reference    intakes for calcium  and D. [place]: The    [employer]. 2. Holick MF, Binkley Kilbourne, Bischoff-Ferrari HA, et al.    Evaluation, treatment, and prevention of vitamin D     deficiency: an Endocrine Society clinical practice    guideline. JCEM. 2011 Jul; 96(7):1911-30.   B12 and Folate Panel     Status: None   Collection Time: 01/21/24  8:58 AM  Result Value Ref Range   Vitamin B-12 698 232 - 1,245 pg/mL   Folate 11.3 >3.0 ng/mL    Comment: A serum folate concentration of less than 3.1 ng/mL is considered to represent clinical deficiency.     Diabetic Foot Exam:  Diabetic foot exam was performed with the following findings:   No deformities, ulcerations, or other skin breakdown Normal sensation of 10g monofilament Intact posterior tibialis and dorsalis pedis pulses      PHQ2/9:    08/02/2023    9:40 AM 05/02/2023    9:47 AM 01/29/2023     9:29 AM 10/30/2022   11:45 AM 10/21/2021    8:39 AM  Depression screen PHQ 2/9  Decreased Interest 0 0 0 0 0  Down, Depressed, Hopeless 0 0 0 0 0  PHQ - 2 Score 0 0 0 0 0  Altered sleeping 0 0 0 0 0  Tired, decreased energy 0 0 0 0 0  Change in appetite 0 0 0 0 0  Feeling bad or failure about yourself  0 0 0 0 0  Trouble concentrating 0 0 0 0 0  Moving slowly or fidgety/restless 0 0 0 0 0  Suicidal thoughts 0 0 0 0 0  PHQ-9 Score 0 0 0 0 0  Difficult doing work/chores Not difficult at all        phq 9 is negative  Fall Risk:    08/02/2023    9:40 AM 05/02/2023    9:47 AM 01/29/2023    9:29 AM 10/30/2022   11:45 AM 10/21/2021    8:39 AM  Fall Risk   Falls in the past year? 1 1 0 0 0  Number falls in past yr: 0 0 0 0 0  Injury with Fall? 1 1 0 0 0  Risk for fall due to : Impaired balance/gait Orthopedic patient No Fall Risks No Fall Risks No Fall Risks  Follow up Falls prevention discussed;Education provided;Falls evaluation completed Falls  prevention discussed;Education provided;Falls evaluation completed Falls prevention discussed Falls prevention discussed Falls prevention discussed      Data saved with a previous flowsheet row definition      Assessment & Plan Type 2 diabetes mellitus with microalbuminuria, dyslipidemia and HTN  A1c increased to 7.2%, indicating suboptimal glycemic control. Microalbuminuria improved from 391 to 176 mg/g. Current regimen includes Farxiga  and metformin , not at optimal dose due to previous side effects. Lack of regular exercise noted. - Continue Farxiga  10 mg daily. - Increase metformin  to 1500 mg daily, option for split dosing. - Encourage regular physical activity. - Provide 90-day refills for Farxiga  and metformin . - Monitor A1c and microalbuminuria levels regularly.  Hypertension Blood pressure controlled at home, elevated in clinic due to anxiety. Current medication is losartan . - Continue losartan  as prescribed. - Monitor blood  pressure at home regularly. - Reassess blood pressure control at next visit.  Dyslipidemia LDL elevated at 146 mg/dL, HDL low at 36 mg/dL, triglycerides elevated. Not taking rosuvastatin , which could improve lipid profile and reduce cardiovascular risk. - Restart rosuvastatin  to lower LDL and triglycerides. - Encourage regular physical activity and dietary changes, including increased intake of tree nuts and fish. - Monitor lipid panel regularly.  Seasonal allergic rhinitis Experiences seasonal allergies, contributing to snoring. Currently using Sudafed, not recommended due to hypertension. - Discontinue Sudafed. - Prescribe Xyzal  and nasal spray. - Consider sleep study if snoring persists.

## 2024-03-24 ENCOUNTER — Other Ambulatory Visit: Payer: Self-pay | Admitting: Family Medicine

## 2024-03-24 DIAGNOSIS — E785 Hyperlipidemia, unspecified: Secondary | ICD-10-CM

## 2024-04-20 ENCOUNTER — Other Ambulatory Visit: Payer: Self-pay | Admitting: Family Medicine

## 2024-04-20 DIAGNOSIS — E1129 Type 2 diabetes mellitus with other diabetic kidney complication: Secondary | ICD-10-CM

## 2024-04-20 DIAGNOSIS — I10 Essential (primary) hypertension: Secondary | ICD-10-CM

## 2024-05-01 ENCOUNTER — Ambulatory Visit: Admitting: Family Medicine

## 2024-05-01 ENCOUNTER — Encounter: Payer: Self-pay | Admitting: Family Medicine

## 2024-05-01 VITALS — BP 118/70 | HR 84 | Resp 16 | Ht <= 58 in | Wt 120.0 lb

## 2024-05-01 DIAGNOSIS — E1169 Type 2 diabetes mellitus with other specified complication: Secondary | ICD-10-CM

## 2024-05-01 DIAGNOSIS — E1129 Type 2 diabetes mellitus with other diabetic kidney complication: Secondary | ICD-10-CM | POA: Diagnosis not present

## 2024-05-01 DIAGNOSIS — J302 Other seasonal allergic rhinitis: Secondary | ICD-10-CM

## 2024-05-01 DIAGNOSIS — Z7984 Long term (current) use of oral hypoglycemic drugs: Secondary | ICD-10-CM

## 2024-05-01 DIAGNOSIS — Z79899 Other long term (current) drug therapy: Secondary | ICD-10-CM

## 2024-05-01 DIAGNOSIS — E559 Vitamin D deficiency, unspecified: Secondary | ICD-10-CM

## 2024-05-01 DIAGNOSIS — Z23 Encounter for immunization: Secondary | ICD-10-CM | POA: Diagnosis not present

## 2024-05-01 DIAGNOSIS — E785 Hyperlipidemia, unspecified: Secondary | ICD-10-CM

## 2024-05-01 DIAGNOSIS — E1159 Type 2 diabetes mellitus with other circulatory complications: Secondary | ICD-10-CM

## 2024-05-01 DIAGNOSIS — I152 Hypertension secondary to endocrine disorders: Secondary | ICD-10-CM

## 2024-05-01 DIAGNOSIS — Z1211 Encounter for screening for malignant neoplasm of colon: Secondary | ICD-10-CM

## 2024-05-01 DIAGNOSIS — Z789 Other specified health status: Secondary | ICD-10-CM

## 2024-05-01 DIAGNOSIS — R809 Proteinuria, unspecified: Secondary | ICD-10-CM

## 2024-05-01 LAB — POCT GLYCOSYLATED HEMOGLOBIN (HGB A1C): Hemoglobin A1C: 6.7 % — AB (ref 4.0–5.6)

## 2024-05-01 MED ORDER — AZITHROMYCIN 250 MG PO TABS: ORAL_TABLET | ORAL | 0 refills | Status: AC

## 2024-05-01 MED ORDER — VITAMIN D (ERGOCALCIFEROL) 1.25 MG (50000 UNIT) PO CAPS: 50000.0000 [IU] | ORAL_CAPSULE | ORAL | 0 refills | Status: AC

## 2024-05-01 MED ORDER — DAPAGLIFLOZIN PROPANEDIOL 10 MG PO TABS: 10.0000 mg | ORAL_TABLET | Freq: Every day | ORAL | 0 refills | Status: DC

## 2024-05-01 MED ORDER — VITAMIN D3 50 MCG (2000 UT) PO CAPS: 2000.0000 [IU] | ORAL_CAPSULE | Freq: Every day | ORAL | 1 refills | Status: AC

## 2024-05-01 NOTE — Progress Notes (Signed)
 Name: Kathleen Villanueva   MRN: [id number]    DOB: [birth year]   Date:05/01/2024       Progress Note  Subjective  Chief Complaint  Chief Complaint  Patient presents with   Medical Management of Chronic Issues   Discussed the use of AI scribe software for clinical note transcription with the patient, who gave verbal consent to proceed.  History of Present Illness Kathleen Villanueva is a 53 year old female with type 2 diabetes and dyslipidemia who presents for a follow-up visit to review lab results.  Her A1c was 7.2 three months ago and is 6.7 today. She manages her diabetes with metformin  750 mg twice daily and Farxiga  10 mg daily, though she sometimes only takes one metformin  daily due to her busy schedule.  She has dyslipidemia with previous lab results showing high triglycerides, low HDL (36), and high LDL (146). She is currently taking rosuvastatin  without experiencing muscle aches. She has started playing pickleball to increase her physical activity.  She has a history of hypertension and microalbuminuria. No chest pain or palpitations.  Her vitamin D  levels have improved from 20 to 28. She takes a multivitamin for women over 50 but not additional vitamin D  supplements.  She is planning a trip to [place] in two weeks with a group of 11 people, including family and friends. She has concerns about getting sick from vaccines and has not taken a flu shot in the past.  She is due for a colon cancer screening and an eye exam.    Patient Active Problem List   Diagnosis Date Noted   Dysplasia of cervix, low grade (CIN 1) 03/13/2023   Metabolic syndrome 08/13/2018   Dyslipidemia due to type 2 diabetes mellitus (HCC) 08/13/2018   Microalbuminuria 08/13/2018   GAD (generalized anxiety disorder) 08/25/2016   Allergy to other foods 11/25/2015   H/O renal calculi 11/25/2015   Vitamin D  deficiency 11/25/2015   Hyperlipidemia with low HDL 11/25/2015   Overweight (BMI 25.0-29.9)  11/04/2015   Seasonal allergies 11/04/2015   Chronic constipation 11/04/2015   Other bursal cyst, left ankle and foot 11/04/2015    Past Surgical History:  Procedure Laterality Date   AUGMENTATION MAMMAPLASTY Bilateral 07/03/2004   BREAST ENHANCEMENT SURGERY Bilateral    COLPOSCOPY  07/1995   CYSTOSCOPY  2012   DIAGNOSTIC LAPAROSCOPY  04/13/2000   for chronic RLQ pain; normal anatomy   EXTRACORPOREAL SHOCK WAVE LITHOTRIPSY     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   TUBAL LIGATION      Family History  Problem Relation Age of Onset   Cancer Mother        cervical   Cancer Maternal Uncle        Kidney   Heart attack Father    Hypertension Father    Hypercholesterolemia Father    Gout Father    Gout Brother    Breast cancer Cousin 33    Social History   Tobacco Use   Smoking status: Never   Smokeless tobacco: Never  Substance Use Topics   Alcohol use: No    Alcohol/week: 0.0 standard drinks of alcohol     Current Outpatient Medications:    azelastine  (ASTELIN ) 0.1 % nasal spray, Place 2 sprays into both nostrils 2 (two) times daily. Use in each nostril as directed, Disp: 30 mL, Rfl: 2   dapagliflozin  propanediol (FARXIGA ) 10 MG TABS tablet, Take 1 tablet (10 mg total) by mouth daily before breakfast., Disp: 90 tablet, Rfl:  0   fluticasone  (FLONASE ) 50 MCG/ACT nasal spray, Place 2 sprays into both nostrils daily., Disp: 16 g, Rfl: 6   levocetirizine (XYZAL ) 5 MG tablet, Take 1 tablet (5 mg total) by mouth every evening., Disp: 90 tablet, Rfl: 1   losartan  (COZAAR ) 50 MG tablet, Take 1 tablet (50 mg total) by mouth daily., Disp: 90 tablet, Rfl: 1   metFORMIN  (GLUCOPHAGE -XR) 750 MG 24 hr tablet, Take 2 tablets (1,500 mg total) by mouth daily with breakfast., Disp: 180 tablet, Rfl: 0   rosuvastatin  (CRESTOR ) 10 MG tablet, Take 1 tablet (10 mg total) by mouth daily., Disp: 90 tablet, Rfl: 1   Vitamin D , Ergocalciferol , (DRISDOL ) 1.25 MG (50000 UNIT) CAPS capsule, Take 1  capsule (50,000 Units total) by mouth every 7 (seven) days., Disp: 12 capsule, Rfl: 1  No Known Allergies  I personally reviewed active problem list, medication list, allergies, family history with the patient/caregiver today.   ROS  Ten systems reviewed and is negative except as mentioned in HPI    Objective Physical Exam CONSTITUTIONAL: Patient appears well-developed and well-nourished.  No distress. HEENT: Head atraumatic, normocephalic, neck supple. CARDIOVASCULAR: Normal rate, regular rhythm and normal heart sounds.  No murmur heard. No BLE edema. PULMONARY: Effort normal and breath sounds normal. No respiratory distress. ABDOMINAL: There is no tenderness or distention. MUSCULOSKELETAL: Normal gait. Without gross motor or sensory deficit. PSYCHIATRIC: Patient has a normal mood and affect. behavior is normal. Judgment and thought content normal.  Vitals:   05/01/24 1042  BP: 118/70  Pulse: 84  Resp: 16  SpO2: 100%  Weight: 120 lb (54.4 kg)  Height: 4' 10 (1.473 m)    Body mass index is 25.08 kg/m.  Recent Results (from the past 2160 hours)  POCT HgB A1C     Status: Abnormal   Collection Time: 05/01/24 10:59 AM  Result Value Ref Range   Hemoglobin A1C 6.7 (A) 4.0 - 5.6 %   HbA1c POC (<> result, manual entry)     HbA1c, POC (prediabetic range)     HbA1c, POC (controlled diabetic range)      Diabetic Foot Exam:     PHQ2/9:    08/02/2023    9:40 AM 05/02/2023    9:47 AM 01/29/2023    9:29 AM 10/30/2022   11:45 AM 10/21/2021    8:39 AM  Depression screen PHQ 2/9  Decreased Interest 0 0 0 0 0  Down, Depressed, Hopeless 0 0 0 0 0  PHQ - 2 Score 0 0 0 0 0  Altered sleeping 0 0 0 0 0  Tired, decreased energy 0 0 0 0 0  Change in appetite 0 0 0 0 0  Feeling bad or failure about yourself  0 0 0 0 0  Trouble concentrating 0 0 0 0 0  Moving slowly or fidgety/restless 0 0 0 0 0  Suicidal thoughts 0 0 0 0 0  PHQ-9 Score 0 0 0 0 0  Difficult doing work/chores Not  difficult at all        phq 9 is negative  Fall Risk:    08/02/2023    9:40 AM 05/02/2023    9:47 AM 01/29/2023    9:29 AM 10/30/2022   11:45 AM 10/21/2021    8:39 AM  Fall Risk   Falls in the past year? 1 1 0 0 0  Number falls in past yr: 0 0 0 0 0  Injury with Fall? 1 1 0 0 0  Risk  for fall due to : Impaired balance/gait Orthopedic patient No Fall Risks No Fall Risks No Fall Risks  Follow up Falls prevention discussed;Education provided;Falls evaluation completed Falls prevention discussed;Education provided;Falls evaluation completed Falls prevention discussed Falls prevention discussed Falls prevention discussed      Data saved with a previous flowsheet row definition    Assessment & Plan Type 2 diabetes mellitus with diabetic dyslipidemia and hypertension with microalbuminuria Improved A1c from 7.2% to 6.7%. Dyslipidemia with high triglycerides, low HDL, elevated LDL. Hypertension with renal involvement. Partial adherence to metformin  due to pill size, consistent Farxiga  use. No adverse effects from rosuvastatin . - Continue metformin   ER750 mg twice daily and Farxiga  10 mg daily. - Continue rosuvastatin . - Continue Losartan  50 mg - Encourage physical activity and dietary modifications. - Order comprehensive metabolic panel, lipid panel, microalbumin, and glucose tests. - Order B12 and folate levels. - Order eye exam for diabetic retinopathy screening.  Vitamin D  deficiency Vitamin D  levels improved from 20 to 28 ng/mL but remain suboptimal. Current multivitamin insufficient in vitamin D . - Prescribe vitamin D  2000 IU daily OTC. - Continue prescription vitamin D  once weekly for three months, then switch to maintenance dose.  General Health Maintenance Due for colon cancer screening and immunizations. Discussed importance of vaccinations, especially for upcoming travel to [place]. Reluctant towards flu vaccination, agreed to pneumococcal vaccination. - Administer pneumococcal  vaccine. - Order Cologuard for colon cancer screening. - Encourage flu vaccination before travel, but declined.  Travel Orthoptist for travel to [place]. Discussed medication management and potential need for travel-related illness medications. - Provide prescription labels for travel. - Prescribe azithromycin for either URI or travelers diarrhea

## 2024-05-20 LAB — COLOGUARD: COLOGUARD: NEGATIVE

## 2024-05-21 ENCOUNTER — Ambulatory Visit: Payer: Self-pay | Admitting: Family Medicine

## 2024-07-18 ENCOUNTER — Other Ambulatory Visit: Payer: Self-pay | Admitting: Family Medicine

## 2024-07-18 DIAGNOSIS — E785 Hyperlipidemia, unspecified: Secondary | ICD-10-CM

## 2024-07-18 DIAGNOSIS — E1129 Type 2 diabetes mellitus with other diabetic kidney complication: Secondary | ICD-10-CM

## 2024-07-18 NOTE — Telephone Encounter (Signed)
 Requested Prescriptions  Pending Prescriptions Disp Refills   FARXIGA  10 MG TABS tablet [Pharmacy Med Name: FARXIGA  10MG  TABLETS] 90 tablet 0    Sig: TAKE 1 TABLET(10 MG) BY MOUTH DAILY BEFORE BREAKFAST     Endocrinology:  Diabetes - SGLT2 Inhibitors Passed - 07/18/2024  3:33 PM      Passed - Cr in normal range and within 360 days    Creatinine, Ser  Date Value Ref Range Status  01/21/2024 0.88 0.57 - 1.00 mg/dL Final         Passed - HBA1C is between 0 and 7.9 and within 180 days    Hemoglobin A1C  Date Value Ref Range Status  05/01/2024 6.7 (A) 4.0 - 5.6 % Final  02/16/2016 5.5  Final   Hgb A1c MFr Bld  Date Value Ref Range Status  01/21/2024 7.2 (H) 4.8 - 5.6 % Final    Comment:             Prediabetes: 5.7 - 6.4          Diabetes: >6.4          Glycemic control for adults with diabetes: <7.0          Passed - eGFR in normal range and within 360 days    GFR calc Af Amer  Date Value Ref Range Status  07/01/2020 91 >59 mL/min/1.73 Final    Comment:    **In accordance with recommendations from the NKF-ASN Task force,**   [facility] is in the process of updating its eGFR calculation to the   2021 CKD-EPI creatinine equation that estimates kidney function   without a race variable.    GFR calc non Af Amer  Date Value Ref Range Status  07/01/2020 79 >59 mL/min/1.73 Final   eGFR  Date Value Ref Range Status  01/21/2024 80 >59 mL/min/1.73 Final         Passed - Valid encounter within last 6 months    Recent Outpatient Visits           2 months ago Dyslipidemia due to type 2 diabetes mellitus [facility])   [facility] Glenard Mire, MD   5 months ago Dyslipidemia due to type 2 diabetes mellitus [facility])    [facility] Glenard Mire, MD   10 months ago Seasonal allergies   [facility] Leavy Mole, PA-C

## 2024-07-31 LAB — CBC WITH DIFFERENTIAL/PLATELET
Basophils Absolute: 0.1 10*3/uL (ref 0.0–0.2)
Basos: 1 %
EOS (ABSOLUTE): 0.3 10*3/uL (ref 0.0–0.4)
Eos: 5 %
Hematocrit: 49.6 % — ABNORMAL HIGH (ref 34.0–46.6)
Hemoglobin: 16.2 g/dL — ABNORMAL HIGH (ref 11.1–15.9)
Immature Grans (Abs): 0 10*3/uL (ref 0.0–0.1)
Immature Granulocytes: 0 %
Lymphocytes Absolute: 3.1 10*3/uL (ref 0.7–3.1)
Lymphs: 45 %
MCH: 28.4 pg (ref 26.6–33.0)
MCHC: 32.7 g/dL (ref 31.5–35.7)
MCV: 87 fL (ref 79–97)
Monocytes Absolute: 0.6 10*3/uL (ref 0.1–0.9)
Monocytes: 8 %
Neutrophils Absolute: 2.8 10*3/uL (ref 1.4–7.0)
Neutrophils: 41 %
Platelets: 232 10*3/uL (ref 150–450)
RBC: 5.7 x10E6/uL — ABNORMAL HIGH (ref 3.77–5.28)
RDW: 13.1 % (ref 11.7–15.4)
WBC: 6.9 10*3/uL (ref 3.4–10.8)

## 2024-07-31 LAB — COMPREHENSIVE METABOLIC PANEL WITH GFR
ALT: 26 [IU]/L (ref 0–32)
AST: 19 [IU]/L (ref 0–40)
Albumin: 4.5 g/dL (ref 3.8–4.9)
Alkaline Phosphatase: 56 [IU]/L (ref 49–135)
BUN/Creatinine Ratio: 20 (ref 9–23)
BUN: 16 mg/dL (ref 6–24)
Bilirubin Total: 0.4 mg/dL (ref 0.0–1.2)
CO2: 21 mmol/L (ref 20–29)
Calcium: 9.8 mg/dL (ref 8.7–10.2)
Chloride: 103 mmol/L (ref 96–106)
Creatinine, Ser: 0.79 mg/dL (ref 0.57–1.00)
Globulin, Total: 3.5 g/dL (ref 1.5–4.5)
Glucose: 148 mg/dL — ABNORMAL HIGH (ref 70–99)
Potassium: 4.7 mmol/L (ref 3.5–5.2)
Sodium: 141 mmol/L (ref 134–144)
Total Protein: 8 g/dL (ref 6.0–8.5)
eGFR: 90 mL/min/{1.73_m2}

## 2024-07-31 LAB — LIPID PANEL
Chol/HDL Ratio: 6.6 ratio — ABNORMAL HIGH (ref 0.0–4.4)
Cholesterol, Total: 231 mg/dL — ABNORMAL HIGH (ref 100–199)
HDL: 35 mg/dL — ABNORMAL LOW
LDL Chol Calc (NIH): 133 mg/dL — ABNORMAL HIGH (ref 0–99)
Triglycerides: 350 mg/dL — ABNORMAL HIGH (ref 0–149)
VLDL Cholesterol Cal: 63 mg/dL — ABNORMAL HIGH (ref 5–40)

## 2024-07-31 LAB — B12 AND FOLATE PANEL
Folate: 19.9 ng/mL
Vitamin B-12: 842 pg/mL (ref 232–1245)

## 2024-07-31 LAB — MICROALBUMIN / CREATININE URINE RATIO
Creatinine, Urine: 73.5 mg/dL
Microalb/Creat Ratio: 153 mg/g{creat} — ABNORMAL HIGH (ref 0–29)
Microalbumin, Urine: 112.4 ug/mL

## 2024-07-31 LAB — VITAMIN D 25 HYDROXY (VIT D DEFICIENCY, FRACTURES): Vit D, 25-Hydroxy: 23.5 ng/mL — ABNORMAL LOW (ref 30.0–100.0)

## 2024-08-01 ENCOUNTER — Encounter: Payer: Self-pay | Admitting: Family Medicine

## 2024-08-01 ENCOUNTER — Other Ambulatory Visit: Payer: Self-pay | Admitting: Family Medicine

## 2024-08-01 ENCOUNTER — Ambulatory Visit: Admitting: Family Medicine

## 2024-08-01 VITALS — BP 120/76 | HR 78 | Resp 16 | Ht <= 58 in | Wt 120.3 lb

## 2024-08-01 DIAGNOSIS — E1129 Type 2 diabetes mellitus with other diabetic kidney complication: Secondary | ICD-10-CM

## 2024-08-01 DIAGNOSIS — R809 Proteinuria, unspecified: Secondary | ICD-10-CM

## 2024-08-01 DIAGNOSIS — I152 Hypertension secondary to endocrine disorders: Secondary | ICD-10-CM

## 2024-08-01 DIAGNOSIS — R718 Other abnormality of red blood cells: Secondary | ICD-10-CM

## 2024-08-01 DIAGNOSIS — E559 Vitamin D deficiency, unspecified: Secondary | ICD-10-CM

## 2024-08-01 DIAGNOSIS — I10 Essential (primary) hypertension: Secondary | ICD-10-CM

## 2024-08-01 DIAGNOSIS — E1159 Type 2 diabetes mellitus with other circulatory complications: Secondary | ICD-10-CM

## 2024-08-01 DIAGNOSIS — E785 Hyperlipidemia, unspecified: Secondary | ICD-10-CM

## 2024-08-01 DIAGNOSIS — E1169 Type 2 diabetes mellitus with other specified complication: Secondary | ICD-10-CM

## 2024-08-01 DIAGNOSIS — Z7984 Long term (current) use of oral hypoglycemic drugs: Secondary | ICD-10-CM

## 2024-08-01 DIAGNOSIS — Z79899 Other long term (current) drug therapy: Secondary | ICD-10-CM

## 2024-08-01 LAB — POCT GLYCOSYLATED HEMOGLOBIN (HGB A1C): Hemoglobin A1C: 7.1 % — AB (ref 4.0–5.6)

## 2024-08-01 MED ORDER — DAPAGLIFLOZIN PROPANEDIOL 10 MG PO TABS: 10.0000 mg | ORAL_TABLET | Freq: Every day | ORAL | 0 refills | Status: AC

## 2024-08-01 MED ORDER — ROSUVASTATIN CALCIUM 10 MG PO TABS: 10.0000 mg | ORAL_TABLET | Freq: Every day | ORAL | 1 refills | Status: AC

## 2024-08-01 MED ORDER — METFORMIN HCL ER 750 MG PO TB24: 1500.0000 mg | ORAL_TABLET | Freq: Every day | ORAL | 0 refills | Status: AC

## 2024-08-01 MED ORDER — LOSARTAN POTASSIUM 50 MG PO TABS: 50.0000 mg | ORAL_TABLET | Freq: Every day | ORAL | 1 refills | Status: AC

## 2024-08-04 NOTE — Telephone Encounter (Incomplete)
 Duplicate request, refilled 08/01/24.  Requested Prescriptions  Pending Prescriptions Disp Refills   metFORMIN  (GLUCOPHAGE -XR) 750 MG 24 hr tablet [Pharmacy Med Name: METFORMIN  ER 750MG  24HR TABS] 180 tablet     Sig: TAKE 2 TABLETS(1500 MG) BY MOUTH DAILY WITH BREAKFAST     Endocrinology:  Diabetes - Biguanides Failed - 08/04/2024 12:20 PM      Failed - CBC within normal limits and completed in the last 12 months    WBC  Date Value Ref Range Status  07/30/2024 6.9 3.4 - 10.8 x10E3/uL Final  07/11/2018 8.0 4.0 - 10.5 K/uL Final   RBC  Date Value Ref Range Status  07/30/2024 5.70 (H) 3.77 - 5.28 x10E6/uL Final  07/11/2018 5.34 (H) 3.87 - 5.11 MIL/uL Final   Hemoglobin  Date Value Ref Range Status  07/30/2024 16.2 (H) 11.1 - 15.9 g/dL Final   Hematocrit  Date Value Ref Range Status  07/30/2024 49.6 (H) 34.0 - 46.6 % Final   MCHC  Date Value Ref Range Status  07/30/2024 32.7 31.5 - 35.7 g/dL Final  98/90/7979 66.3 30.0 - 36.0 g/dL Final   [facility]  Date Value Ref Range Status  07/30/2024 28.4 26.6 - 33.0 pg Final  07/11/2018 28.1 26.0 - 34.0 pg Final   MCV  Date Value Ref Range Status  07/30/2024 87 79 - 97 fL Final   No results found for: PLTCOUNTKUC, LABPLAT, POCPLA RDW  Date Value Ref Range Status  07/30/2024 13.1 11.7 - 15.4 % Final         Passed - Cr in normal range and within 360 days    Creatinine, Ser  Date Value Ref Range Status  07/30/2024 0.79 0.57 - 1.00 mg/dL Final         Passed - HBA1C is between 0 and 7.9 and within 180 days    Hemoglobin A1C  Date Value Ref Range Status  08/01/2024 7.1 (A) 4.0 - 5.6 % Final  02/16/2016 5.5  Final   Hgb A1c MFr Bld  Date Value Ref Range Status  01/21/2024 7.2 (H) 4.8 - 5.6 % Final    Comment:             Prediabetes: 5.7 - 6.4          Diabetes: >6.4          Glycemic control for adults with diabetes: <7.0          Passed - eGFR in normal range and within 360 days    GFR calc Af Amer  Date Value Ref  Range Status  07/01/2020 91 >59 mL/min/1.73 Final    Comment:    **In accordance with recommendations from the NKF-ASN Task force,**   [facility] is in the process of updating its eGFR calculation to the   2021 CKD-EPI creatinine equation that estimates kidney function   without a race variable.    GFR calc non Af Amer  Date Value Ref Range Status  07/01/2020 79 >59 mL/min/1.73 Final   eGFR  Date Value Ref Range Status  07/30/2024 90 >59 mL/min/1.73 Final         Passed - B12 Level in normal range and within 720 days    Vitamin B-12  Date Value Ref Range Status  07/30/2024 842 232 - 1,245 pg/mL Final         Passed - Valid encounter within last 6 months    Recent Outpatient Visits           3 days ago  Dyslipidemia due to type 2 diabetes mellitus [facility])   [place] [facility] [place], Dorette, MD   3 months ago Dyslipidemia due to type 2 diabetes mellitus [facility])   [place] [facility] [place] Dorette, MD   6 months ago Dyslipidemia due to type 2 diabetes mellitus [facility])   [facility] [place] Dorette, MD   11 months ago Seasonal allergies   [facility] Leavy Mole, PA-C

## 2024-11-07 ENCOUNTER — Ambulatory Visit: Admitting: Family Medicine
